# Patient Record
Sex: Male | Born: 1950 | Race: White | Hispanic: No | Marital: Married | Smoking: Never smoker
Health system: Southern US, Community
[De-identification: ages and names within clinical notes are randomized; demographics above are authoritative.]

## PROBLEM LIST (undated history)

## (undated) DIAGNOSIS — M1990 Unspecified osteoarthritis, unspecified site: Secondary | ICD-10-CM

## (undated) DIAGNOSIS — G4730 Sleep apnea, unspecified: Secondary | ICD-10-CM

## (undated) DIAGNOSIS — I10 Essential (primary) hypertension: Secondary | ICD-10-CM

## (undated) DIAGNOSIS — D751 Secondary polycythemia: Secondary | ICD-10-CM

## (undated) DIAGNOSIS — F419 Anxiety disorder, unspecified: Secondary | ICD-10-CM

## (undated) DIAGNOSIS — C801 Malignant (primary) neoplasm, unspecified: Secondary | ICD-10-CM

## (undated) HISTORY — PX: ORCHIECTOMY: SHX2116

## (undated) HISTORY — PX: COLONOSCOPY: SHX174

## (undated) HISTORY — PX: HERNIA REPAIR: SHX51

## (undated) HISTORY — PX: ANTERIOR CRUCIATE LIGAMENT REPAIR: SHX115

---

## 2011-08-01 ENCOUNTER — Encounter (HOSPITAL_BASED_OUTPATIENT_CLINIC_OR_DEPARTMENT_OTHER): Payer: Self-pay | Admitting: *Deleted

## 2011-08-01 ENCOUNTER — Other Ambulatory Visit: Payer: Self-pay | Admitting: Orthopedic Surgery

## 2011-08-01 NOTE — Progress Notes (Signed)
Pt added on surgery tomorrow.-called 4x-finally got pt Affect very flat-monotone voice-simple answers-very hard to get any information-poor historian Called pcp for notes and labs-has polycythemia-not had a phlebotomy in yrs. Denies any cardiac issues Had a recent sleep study-moderate-has not gone for cpap titration To come in 6am-bring all meds-pk an overnight bag

## 2011-08-01 NOTE — Progress Notes (Signed)
Reviewed assessment with dr frederick-waiting on PCP notes-did tell pt he may need to stay overnight due to new dx sleep apnea without a cpap

## 2011-08-02 ENCOUNTER — Encounter (HOSPITAL_BASED_OUTPATIENT_CLINIC_OR_DEPARTMENT_OTHER): Payer: Self-pay | Admitting: *Deleted

## 2011-08-02 ENCOUNTER — Ambulatory Visit (HOSPITAL_BASED_OUTPATIENT_CLINIC_OR_DEPARTMENT_OTHER)
Admission: RE | Admit: 2011-08-02 | Discharge: 2011-08-02 | Disposition: A | Discharge status: Home / Self Care | Payer: BC Managed Care – PPO | Source: Ambulatory Visit | Attending: Orthopedic Surgery | Admitting: Orthopedic Surgery

## 2011-08-02 ENCOUNTER — Ambulatory Visit (HOSPITAL_BASED_OUTPATIENT_CLINIC_OR_DEPARTMENT_OTHER): Payer: BC Managed Care – PPO | Admitting: Anesthesiology

## 2011-08-02 ENCOUNTER — Encounter (HOSPITAL_BASED_OUTPATIENT_CLINIC_OR_DEPARTMENT_OTHER): Payer: Self-pay | Admitting: Anesthesiology

## 2011-08-02 ENCOUNTER — Other Ambulatory Visit: Payer: Self-pay

## 2011-08-02 ENCOUNTER — Encounter (HOSPITAL_BASED_OUTPATIENT_CLINIC_OR_DEPARTMENT_OTHER)
Admission: RE | Disposition: A | Discharge status: Home / Self Care | Payer: Self-pay | Source: Ambulatory Visit | Attending: Orthopedic Surgery

## 2011-08-02 DIAGNOSIS — G4730 Sleep apnea, unspecified: Secondary | ICD-10-CM | POA: Insufficient documentation

## 2011-08-02 DIAGNOSIS — M2350 Chronic instability of knee, unspecified knee: Secondary | ICD-10-CM | POA: Insufficient documentation

## 2011-08-02 DIAGNOSIS — M2240 Chondromalacia patellae, unspecified knee: Secondary | ICD-10-CM | POA: Insufficient documentation

## 2011-08-02 DIAGNOSIS — Z8547 Personal history of malignant neoplasm of testis: Secondary | ICD-10-CM | POA: Insufficient documentation

## 2011-08-02 DIAGNOSIS — M23329 Other meniscus derangements, posterior horn of medial meniscus, unspecified knee: Secondary | ICD-10-CM | POA: Insufficient documentation

## 2011-08-02 DIAGNOSIS — I10 Essential (primary) hypertension: Secondary | ICD-10-CM | POA: Insufficient documentation

## 2011-08-02 HISTORY — DX: Essential (primary) hypertension: I10

## 2011-08-02 HISTORY — DX: Malignant (primary) neoplasm, unspecified: C80.1

## 2011-08-02 HISTORY — DX: Sleep apnea, unspecified: G47.30

## 2011-08-02 HISTORY — DX: Secondary polycythemia: D75.1

## 2011-08-02 LAB — POCT I-STAT, CHEM 8
BUN: 23 mg/dL (ref 6–23)
Calcium, Ion: 1.12 mmol/L (ref 1.12–1.23)
Chloride: 105 mEq/L (ref 96–112)
Creatinine, Ser: 1.3 mg/dL (ref 0.50–1.35)
Glucose, Bld: 102 mg/dL — ABNORMAL HIGH (ref 70–99)
HCT: 56 % — ABNORMAL HIGH (ref 39.0–52.0)
Hemoglobin: 19 g/dL — ABNORMAL HIGH (ref 13.0–17.0)
Potassium: 4.7 mEq/L (ref 3.5–5.1)
Sodium: 139 mEq/L (ref 135–145)
TCO2: 27 mmol/L (ref 0–100)

## 2011-08-02 SURGERY — KNEE ARTHROSCOPY WITH ANTERIOR CRUCIATE LIGAMENT (ACL) REPAIR
Anesthesia: General | Site: Knee | Laterality: Left | Wound class: Clean

## 2011-08-02 MED ORDER — OXYCODONE-ACETAMINOPHEN 5-325 MG PO TABS: 1.0000 | ORAL_TABLET | Freq: Four times a day (QID) | ORAL | Status: AC | PRN

## 2011-08-02 MED ORDER — LACTATED RINGERS IV SOLN
INTRAVENOUS | Status: DC
  Administered 2011-08-02: 07:00:00 via INTRAVENOUS

## 2011-08-02 MED ORDER — HYDROMORPHONE HCL PF 1 MG/ML IJ SOLN
0.2500 mg | INTRAMUSCULAR | Status: DC | PRN
  Administered 2011-08-02 (×2): 0.5 mg via INTRAVENOUS

## 2011-08-02 MED ORDER — FENTANYL CITRATE 0.05 MG/ML IJ SOLN
INTRAMUSCULAR | Status: DC | PRN
  Administered 2011-08-02: 50 ug via INTRAVENOUS

## 2011-08-02 MED ORDER — KETOROLAC TROMETHAMINE 30 MG/ML IJ SOLN
INTRAMUSCULAR | Status: DC | PRN
  Administered 2011-08-02: 30 mg via INTRAVENOUS

## 2011-08-02 MED ORDER — CEFAZOLIN SODIUM-DEXTROSE 2-3 GM-% IV SOLR
2.0000 g | INTRAVENOUS | Status: AC
  Administered 2011-08-02: 2 g via INTRAVENOUS

## 2011-08-02 MED ORDER — DEXAMETHASONE SODIUM PHOSPHATE 4 MG/ML IJ SOLN
INTRAMUSCULAR | Status: DC | PRN
  Administered 2011-08-02: 10 mg via INTRAVENOUS

## 2011-08-02 MED ORDER — MIDAZOLAM HCL 2 MG/2ML IJ SOLN
1.0000 mg | INTRAMUSCULAR | Status: DC | PRN
  Administered 2011-08-02: 2 mg via INTRAVENOUS

## 2011-08-02 MED ORDER — LIDOCAINE HCL (CARDIAC) 20 MG/ML IV SOLN
INTRAVENOUS | Status: DC | PRN
  Administered 2011-08-02: 50 mg via INTRAVENOUS

## 2011-08-02 MED ORDER — CEFAZOLIN SODIUM 1-5 GM-% IV SOLN
1.0000 g | Freq: Once | INTRAVENOUS | Status: AC
  Administered 2011-08-02: 1 g via INTRAVENOUS

## 2011-08-02 MED ORDER — ONDANSETRON HCL 4 MG/2ML IJ SOLN
INTRAMUSCULAR | Status: DC | PRN
  Administered 2011-08-02: 4 mg via INTRAVENOUS

## 2011-08-02 MED ORDER — ACETAMINOPHEN 10 MG/ML IV SOLN
1000.0000 mg | Freq: Once | INTRAVENOUS | Status: AC
  Administered 2011-08-02: 1000 mg via INTRAVENOUS

## 2011-08-02 MED ORDER — EPHEDRINE SULFATE 50 MG/ML IJ SOLN
INTRAMUSCULAR | Status: DC | PRN
  Administered 2011-08-02 (×2): 10 mg via INTRAVENOUS
  Administered 2011-08-02: 5 mg via INTRAVENOUS
  Administered 2011-08-02: 10 mg via INTRAVENOUS

## 2011-08-02 MED ORDER — PHENYLEPHRINE HCL 10 MG/ML IJ SOLN
INTRAMUSCULAR | Status: DC | PRN
  Administered 2011-08-02 (×2): 80 ug via INTRAVENOUS

## 2011-08-02 MED ORDER — PROPOFOL 10 MG/ML IV EMUL
INTRAVENOUS | Status: DC | PRN
  Administered 2011-08-02: 300 mg via INTRAVENOUS

## 2011-08-02 MED ORDER — BUPIVACAINE-EPINEPHRINE PF 0.5-1:200000 % IJ SOLN
INTRAMUSCULAR | Status: DC | PRN
  Administered 2011-08-02: 25 mL

## 2011-08-02 MED ORDER — FENTANYL CITRATE 0.05 MG/ML IJ SOLN
50.0000 ug | INTRAMUSCULAR | Status: DC | PRN
  Administered 2011-08-02: 100 ug via INTRAVENOUS

## 2011-08-02 MED ORDER — LACTATED RINGERS IV SOLN
INTRAVENOUS | Status: DC
  Administered 2011-08-02 (×3): via INTRAVENOUS

## 2011-08-02 MED ORDER — CHLORHEXIDINE GLUCONATE 4 % EX LIQD: 60.0000 mL | Freq: Once | CUTANEOUS | Status: DC

## 2011-08-02 MED ORDER — OXYCODONE HCL 5 MG PO TABS: 5.0000 mg | ORAL_TABLET | Freq: Once | ORAL | Status: DC | PRN

## 2011-08-02 MED ORDER — METOCLOPRAMIDE HCL 5 MG/ML IJ SOLN: 10.0000 mg | Freq: Once | INTRAMUSCULAR | Status: DC | PRN

## 2011-08-02 MED ORDER — OXYCODONE HCL 5 MG/5ML PO SOLN: 5.0000 mg | Freq: Once | ORAL | Status: DC | PRN

## 2011-08-02 SURGICAL SUPPLY — 71 items
BANDAGE ESMARK 6X9 LF (GAUZE/BANDAGES/DRESSINGS) IMPLANT
BENZOIN TINCTURE PRP APPL 2/3 (GAUZE/BANDAGES/DRESSINGS) ×2 IMPLANT
BLADE 4.2CUDA (BLADE) IMPLANT
BLADE AVERAGE 25X9 (BLADE) ×2 IMPLANT
BLADE CUDA 5.5 (BLADE) IMPLANT
BLADE CUTTER GATOR 3.5 (BLADE) IMPLANT
BLADE GREAT WHITE 4.2 (BLADE) ×2 IMPLANT
BLADE OSCIL/SAGITTAL W/10 ST (BLADE) IMPLANT
BLADE SURG 15 STRL LF DISP TIS (BLADE) ×1 IMPLANT
BLADE SURG 15 STRL SS (BLADE) ×1
BNDG ESMARK 6X9 LF (GAUZE/BANDAGES/DRESSINGS)
BUR OVAL 4.0 (BURR) ×2 IMPLANT
CANISTER OMNI JUG 16 LITER (MISCELLANEOUS) ×2 IMPLANT
CANISTER SUCTION 2500CC (MISCELLANEOUS) IMPLANT
CLOTH BEACON ORANGE TIMEOUT ST (SAFETY) ×2 IMPLANT
COVER TABLE BACK 60X90 (DRAPES) ×2 IMPLANT
DRAPE ARTHROSCOPY W/POUCH 114 (DRAPES) ×2 IMPLANT
DRSG EMULSION OIL 3X3 NADH (GAUZE/BANDAGES/DRESSINGS) ×2 IMPLANT
DURAPREP 26ML APPLICATOR (WOUND CARE) ×2 IMPLANT
ELECT MENISCUS 165MM 90D (ELECTRODE) IMPLANT
ELECT REM PT RETURN 9FT ADLT (ELECTROSURGICAL) ×2
ELECTRODE REM PT RTRN 9FT ADLT (ELECTROSURGICAL) ×1 IMPLANT
GLOVE BIOGEL PI IND STRL 8 (GLOVE) ×3 IMPLANT
GLOVE BIOGEL PI INDICATOR 8 (GLOVE) ×3
GLOVE ECLIPSE 7.5 STRL STRAW (GLOVE) ×4 IMPLANT
GOWN BRE IMP PREV XXLGXLNG (GOWN DISPOSABLE) ×2 IMPLANT
GOWN PREVENTION PLUS XLARGE (GOWN DISPOSABLE) ×2 IMPLANT
GOWN PREVENTION PLUS XXLARGE (GOWN DISPOSABLE) ×4 IMPLANT
HOLDER KNEE FOAM BLUE (MISCELLANEOUS) ×2 IMPLANT
IMMOBILIZER KNEE 22 UNIV (SOFTGOODS) IMPLANT
IMMOBILIZER KNEE 24 THIGH 36 (MISCELLANEOUS) ×1 IMPLANT
IMMOBILIZER KNEE 24 UNIV (MISCELLANEOUS) ×2
KIT TRANSTIBIAL (DISPOSABLE) ×2 IMPLANT
KNEE WRAP E Z 3 GEL PACK (MISCELLANEOUS) ×2 IMPLANT
KNIFE GRAFT ACL 10MM 5952 (MISCELLANEOUS) IMPLANT
KNIFE GRAFT ACL 11MM (MISCELLANEOUS) IMPLANT
NDL SAFETY ECLIPSE 18X1.5 (NEEDLE) IMPLANT
NEEDLE FILTER BLUNT 18X 1/2SAF (NEEDLE)
NEEDLE FILTER BLUNT 18X1 1/2 (NEEDLE) IMPLANT
NEEDLE HYPO 18GX1.5 SHARP (NEEDLE)
NEEDLE MENISCAL REPAIR DBL ARM (NEEDLE) IMPLANT
PACK ARTHROSCOPY DSU (CUSTOM PROCEDURE TRAY) ×2 IMPLANT
PACK BASIN DAY SURGERY FS (CUSTOM PROCEDURE TRAY) ×2 IMPLANT
PAD CAST 4YDX4 CTTN HI CHSV (CAST SUPPLIES) ×2 IMPLANT
PADDING CAST COTTON 4X4 STRL (CAST SUPPLIES) ×2
PASSER SUT SWANSON 36MM LOOP (INSTRUMENTS) IMPLANT
PATELLA LIGAMENT BISECTED FR (Tissue) ×2 IMPLANT
PENCIL BUTTON HOLSTER BLD 10FT (ELECTRODE) IMPLANT
SCREW INTERFERENCE 8X25MM (Screw) ×2 IMPLANT
SCREW SHEATHED INTERF 7X20 (Screw) ×2 IMPLANT
SET ARTHROSCOPY TUBING (MISCELLANEOUS) ×1
SET ARTHROSCOPY TUBING LN (MISCELLANEOUS) ×1 IMPLANT
SHEET MEDIUM DRAPE 40X70 STRL (DRAPES) ×2 IMPLANT
SPONGE GAUZE 4X4 12PLY (GAUZE/BANDAGES/DRESSINGS) ×2 IMPLANT
SPONGE LAP 4X18 X RAY DECT (DISPOSABLE) ×2 IMPLANT
STRIP CLOSURE SKIN 1/2X4 (GAUZE/BANDAGES/DRESSINGS) ×2 IMPLANT
SUCTION FRAZIER TIP 10 FR DISP (SUCTIONS) ×2 IMPLANT
SUT ETHILON 4 0 PS 2 18 (SUTURE) IMPLANT
SUT MNCRL AB 3-0 PS2 18 (SUTURE) ×2 IMPLANT
SUT PDS AB 1 CT  36 (SUTURE) ×2
SUT PDS AB 1 CT 36 (SUTURE) ×2 IMPLANT
SUT STEEL 5 (SUTURE) ×2 IMPLANT
SUT TICRON 1 T 12 (SUTURE) IMPLANT
SUT VIC AB 0 CT1 27 (SUTURE)
SUT VIC AB 0 CT1 27XBRD ANBCTR (SUTURE) IMPLANT
SUT VIC AB 2-0 SH 27 (SUTURE)
SUT VIC AB 2-0 SH 27XBRD (SUTURE) IMPLANT
SYR 5ML LL (SYRINGE) ×2 IMPLANT
TOWEL OR 17X24 6PK STRL BLUE (TOWEL DISPOSABLE) ×6 IMPLANT
TOWEL OR NON WOVEN STRL DISP B (DISPOSABLE) ×2 IMPLANT
WATER STERILE IRR 1000ML POUR (IV SOLUTION) ×2 IMPLANT

## 2011-08-02 NOTE — Transfer of Care (Signed)
Immediate Anesthesia Transfer of Care Note  Patient: Christopher Ramsey  Procedure(s) Performed: Procedure(s) (LRB): KNEE ARTHROSCOPY WITH ANTERIOR CRUCIATE LIGAMENT (ACL) REPAIR (Left)  Patient Location: PACU  Anesthesia Type: GA combined with regional for post-op pain  Level of Consciousness: sedated  Airway & Oxygen Therapy: Patient Spontanous Breathing and Patient connected to face mask oxygen  Post-op Assessment: Report given to PACU RN and Post -op Vital signs reviewed and stable  Post vital signs: Reviewed and stable  Complications: No apparent anesthesia complications

## 2011-08-02 NOTE — Anesthesia Preprocedure Evaluation (Signed)
Anesthesia Evaluation  Patient identified by MRN, date of birth, ID band Patient awake    Reviewed: Allergy & Precautions, H&P , NPO status , Patient's Chart, lab work & pertinent test results, reviewed documented beta blocker date and time   Airway Mallampati: II TM Distance: >3 FB Neck ROM: full    Dental   Pulmonary sleep apnea ,  breath sounds clear to auscultation        Cardiovascular hypertension, On Medications Rhythm:regular     Neuro/Psych negative neurological ROS  negative psych ROS   GI/Hepatic negative GI ROS, Neg liver ROS,   Endo/Other  negative endocrine ROS  Renal/GU negative Renal ROS  negative genitourinary   Musculoskeletal   Abdominal   Peds  Hematology negative hematology ROS (+)   Anesthesia Other Findings See surgeon's H&P   Reproductive/Obstetrics negative OB ROS                           Anesthesia Physical Anesthesia Plan  ASA: III  Anesthesia Plan: General   Post-op Pain Management:    Induction: Intravenous  Airway Management Planned: LMA  Additional Equipment:   Intra-op Plan:   Post-operative Plan: Extubation in OR  Informed Consent: I have reviewed the patients History and Physical, chart, labs and discussed the procedure including the risks, benefits and alternatives for the proposed anesthesia with the patient or authorized representative who has indicated his/her understanding and acceptance.   Dental Advisory Given  Plan Discussed with: CRNA and Surgeon  Anesthesia Plan Comments:         Anesthesia Quick Evaluation

## 2011-08-02 NOTE — Brief Op Note (Signed)
08/02/2011  9:43 AM  PATIENT:  Tor Netters  61 y.o. male  PRE-OPERATIVE DIAGNOSIS:  Anterior cruciate ligament TEAR AND MEDIAL MENISCUS TEAR  POST-OPERATIVE DIAGNOSIS:  same as preop plus chondromalacia  PROCEDURE:  Procedure(s) (LRB): KNEE ARTHROSCOPY WITH ANTERIOR CRUCIATE LIGAMENT (ACL) REPAIR (Left)  SURGEON:  Surgeon(s) and Role:    * Harvie Junior, MD - Primary  PHYSICIAN ASSISTANT:   ASSISTANTS: bethune   ANESTHESIA:   general  EBL:  Total I/O In: 2000 [I.V.:2000] Out: -   BLOOD ADMINISTERED:none  DRAINS: none   LOCAL MEDICATIONS USED:  NONE  SPECIMEN:  No Specimen  DISPOSITION OF SPECIMEN:  N/A  COUNTS:  YES  TOURNIQUET:   Total Tourniquet Time Documented: Thigh (Left) - 24 minutes  DICTATION: .Other Dictation: Dictation Number [id number]  PLAN OF CARE: Discharge to home after PACU  PATIENT DISPOSITION:  PACU - hemodynamically stable.   Delay start of Pharmacological VTE agent (>24hrs) due to surgical blood loss or risk of bleeding: not applicable

## 2011-08-02 NOTE — Anesthesia Procedure Notes (Addendum)
Anesthesia Regional Block:  Femoral nerve block  Pre-Anesthetic Checklist: ,, timeout performed, Correct Patient, Correct Site, Correct Laterality, Correct Procedure, Correct Position, site marked, Risks and benefits discussed,  Surgical consent,  Pre-op evaluation,  At surgeon's request and post-op pain management  Laterality: Left  Prep: chloraprep       Needles:   Needle Type: Other   (Arrow Echogenic)   Needle Length: 9cm  Needle Gauge: 21    Additional Needles:  Procedures: ultrasound guided Femoral nerve block Narrative:  Start time: 08/02/2011 7:18 AM End time: 08/02/2011 7:25 AM Injection made incrementally with aspirations every 5 mL.  Performed by: Personally  Anesthesiologist: C. Frederick MD  Additional Notes: Ultrasound guidance used to: id relevant anatomy, confirm needle position, local anesthetic spread, avoidance of vascular puncture. Picture saved. No complications. Block performed personally by Janetta Hora. Gelene Mink, MD    Femoral nerve block Procedure Name: LMA Insertion Date/Time: 08/02/2011 7:52 AM Performed by: Burna Cash Pre-anesthesia Checklist: Patient identified, Emergency Drugs available, Suction available and Patient being monitored Patient Re-evaluated:Patient Re-evaluated prior to inductionOxygen Delivery Method: Circle System Utilized Preoxygenation: Pre-oxygenation with 100% oxygen Intubation Type: IV induction Ventilation: Mask ventilation without difficulty LMA: LMA inserted LMA Size: 5.0 Number of attempts: 1 Airway Equipment and Method: bite block Placement Confirmation: positive ETCO2 Tube secured with: Tape Dental Injury: Teeth and Oropharynx as per pre-operative assessment

## 2011-08-02 NOTE — Progress Notes (Signed)
Assisted Dr. Frederick with left, ultrasound guided, femoral block. Side rails up, monitors on throughout procedure. See vital signs in flow sheet. Tolerated Procedure well. 

## 2011-08-02 NOTE — Anesthesia Postprocedure Evaluation (Signed)
Anesthesia Post Note  Patient: Christopher Ramsey  Procedure(s) Performed: Procedure(s) (LRB): KNEE ARTHROSCOPY WITH ANTERIOR CRUCIATE LIGAMENT (ACL) REPAIR (Left)  Anesthesia type: General  Patient location: PACU  Post pain: Pain level controlled  Post assessment: Patient's Cardiovascular Status Stable  Last Vitals:  Filed Vitals:   08/02/11 1030  BP: 135/69  Pulse: 79  Temp:   Resp: 17    Post vital signs: Reviewed and stable  Level of consciousness: alert  Complications: No apparent anesthesia complications

## 2011-08-02 NOTE — H&P (Signed)
PREOPERATIVE H&P  Chief Complaint: l lrg instability and pain  HPI: Christopher Ramsey is a 61 y.o. male who presents for evaluation of l knee instability and pain. It has been present for greater than 1 year and has been worsening. He has failed conservative measures. Pain is rated as moderate.  Past Medical History  Diagnosis Date  . Hypertension   . Polycythemia   . Cancer     hx testicular cancer  . Sleep apnea     recent sleep study-moderate-has not done cpap trial   Past Surgical History  Procedure Date  . Hernia repair     rt ing age 35  . 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 Marital Status: Married    Spouse Name: N/A    Number of Children: N/A  . Years of Education: N/A   Social History Main Topics  . Smoking status: Never Smoker   . Smokeless tobacco: None  . Alcohol Use: Yes     daily wine  . Drug Use: No  . Sexually Active:    Other Topics Concern  . None   Social History Narrative  . None   History reviewed. No pertinent family history. No Known Allergies Prior to Admission medications   Medication Sig Start Date End Date Taking? Authorizing Provider  aspirin 325 MG tablet Take 325 mg by mouth daily.   Yes Historical Provider, MD  losartan (COZAAR) 100 MG tablet Take 100 mg by mouth daily.   Yes Historical Provider, MD  testosterone (ANDROGEL) 50 MG/5GM GEL Place 5 g onto the skin daily.   Yes Historical Provider, MD     Positive ROS: none  All other systems have been reviewed and were otherwise negative with the exception of those mentioned in the HPI and as above.  Physical Exam: Filed Vitals:   08/02/11 0736  BP:   Pulse: 80  Temp:   Resp: 13    General: Alert, no acute distress Cardiovascular: No pedal edema Respiratory: No cyanosis, no use of accessory musculature GI: No organomegaly, abdomen is soft and non-tender Skin: No lesions in the area of chief complaint Neurologic:  Sensation intact distally Psychiatric: Patient is competent for consent with normal mood and affect Lymphatic: No axillary or cervical lymphadenopathy  MUSCULOSKELETAL: l. Knee : + instability/ lochman and pivot shift.  + Mcmurray med side  Assessment/Plan: ACL TEAR AND MEDIAL MENISCUS TEAR  Pt has history of instability as his primary complaint  Plan for Procedure(s): KNEE ARTHROSCOPY WITH ANTERIOR CRUCIATE LIGAMENT (ACL) REPAIR  The risks benefits and alternatives were discussed with the patient including but not limited to the risks of nonoperative treatment, versus surgical intervention including infection, bleeding, nerve injury, malunion, nonunion, hardware prominence, hardware failure, need for hardware removal, blood clots, cardiopulmonary complications, morbidity, mortality, among others, and they were willing to proceed.  Predicted outcome is good, although there will be at least a six to nine month expected recovery.  Weltha Cathy L, MD 08/02/2011 7:38 AM

## 2011-08-05 NOTE — Op Note (Signed)
NAMEDEXTON, [name]                ACCOUNT NO.:  [id number]  MEDICAL RECORD NO.:  [id number]  LOCATION:                               FACILITY:  [facility]  PHYSICIAN:  Harvie Junior, M.D.        DATE OF BIRTH:  DATE OF PROCEDURE:  08/02/2011 DATE OF DISCHARGE:  08/02/2011                              OPERATIVE REPORT   PREOPERATIVE DIAGNOSES: 1. Left knee instability. 2. Medial meniscal tear.  POSTOPERATIVE DIAGNOSES: 1. Left knee instability. 2. Medial meniscal tear. 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  PRINCIPAL PROCEDURE: 1. Anterior cruciate ligament reconstruction with central one-third     patellar tendon allograft. 2. Partial medial meniscectomy. 3. Chondroplasty of lateral tibial plateau.  SURGEON:  Harvie Junior, MD  ASSISTANT:  Marshia Ly, PA  ANESTHESIA:  General.  HISTORY:  Mr. Christopher Ramsey a 61 year old male history of having had significant complaints of instability in his left knee for many many years.  He unfortunately recently had a twisting injury and began having pain associated with it.  MRI showed that he had a medial meniscal tear. Plain x-ray showed no significant narrowing of the joint space.  His biggest complaint was instability that he had had over these many years and he wanted to have this corrected.  We talked at length about the issues related to ACL reconstruction in a 61 year old feeling that pain relief was really what we are after and that will be performed with a meniscectomy.  He continued to state that instability was his biggest issue and because of continued feeling that instability was the biggest issue here, we elected to do anterior cruciate ligament reconstruction with our arthroscopy with the provider that it there was significant arthritic change or degenerative change that we would not do ACL reconstruction.  I think he understood all the issues, all the risks, and he was taken to the operating room for this  procedure.  PROCEDURE:  The patient was taken to the operating room and after adequate anesthesia was obtained with general anesthetic, the patient was placed supine on the operating table.  The left leg was prepped and draped in usual sterile fashion.  Following this, the leg was examined arthroscopically and shown to have a posterior horn medial meniscal tear, which was debrided back to a smooth and stable rim.  In the lateral compartment, there were some grade 2 chondromalacia of the lateral tibial plateau, which was debrided.  Attention was turned to the notch where there was no fibers of the ACL on the prominent tibial spine.  There was significant stenosis of the notch.  Patellofemoral joint showed no degenerative changes.  At this point, given that there was no degenerative changes and instability was the biggest issue, we felt that ACL reconstruction was appropriate and so at this point, a notchplasty was performed with a motorized bur.  We had to take a tremendous amount of bone out to get to where we could see to put an ACL.  Once this was done, the ACL guide was used 7-mm anterior to the posterior cruciate and a guidewire was placed over this, it was overdrilled with an 11-mm tunnel  followed by over-the-top guide and a pilot hole was drilled to make sure that we had a tunnel on the femoral side and then this was drilled to a level of 25.  Once this was done, a notch was made and the graft was advanced into the knee.  Once this was done, the graft was locked in on the femoral side with a 7 x 20 screw with a sheath to protect the graft.  Once this was done, the knee was cycled 20 times.  No tendency towards an isometry, and at this point, the tibial side of graft was locked in place with 8 x 25 screw.  Once this was done, the leg was now examined, it was noted to be now Lachman's negative, pivot negative.  Final check was made medial and lateral from the loosen fragment, pieces  of bone and cartilage and none was seen.  At this time, the knee was copiously and thoroughly lavaged, suctioned dry, and the wound was now closed in layers.  Tourniquet was let up during the passage of the graft just could have some difficulty seeing with effort by 23 minutes.  Once this was done, the wounds were closed in layers.  Single stitch was placed in the medial wound, the lateral wound was left open.  Sterile compressive dressing was applied, and the patient was taken to the recovery room, was noted to be in satisfactory condition.  Estimated blood loss for the procedure was none.  Marshia Ly was assisted throughout the case, was critical in the performance with both preparation of the graft and retraction and allowing for passage of the graft.     Harvie Junior, M.D.     Christopher Ramsey  D:  08/02/2011  T:  08/03/2011  Job:  [id number]

## 2013-11-07 DEATH — deceased

## 2018-12-23 ENCOUNTER — Other Ambulatory Visit: Payer: Self-pay | Admitting: Neurosurgery

## 2018-12-23 DIAGNOSIS — M79604 Pain in right leg: Secondary | ICD-10-CM

## 2019-01-22 ENCOUNTER — Other Ambulatory Visit: Payer: Self-pay

## 2019-01-22 ENCOUNTER — Ambulatory Visit
Admission: RE | Admit: 2019-01-22 | Discharge: 2019-01-22 | Disposition: A | Discharge status: Home / Self Care | Payer: Medicare PPO | Source: Ambulatory Visit | Attending: Neurosurgery | Admitting: Neurosurgery

## 2019-01-22 DIAGNOSIS — M79604 Pain in right leg: Secondary | ICD-10-CM

## 2019-10-19 ENCOUNTER — Other Ambulatory Visit: Payer: Self-pay | Admitting: Neurosurgery

## 2019-10-21 ENCOUNTER — Other Ambulatory Visit: Payer: Self-pay | Admitting: Neurosurgery

## 2019-10-21 DIAGNOSIS — M8580 Other specified disorders of bone density and structure, unspecified site: Secondary | ICD-10-CM

## 2019-12-07 ENCOUNTER — Other Ambulatory Visit: Payer: Self-pay

## 2019-12-07 ENCOUNTER — Ambulatory Visit
Admission: RE | Admit: 2019-12-07 | Discharge: 2019-12-07 | Disposition: A | Discharge status: Home / Self Care | Payer: Medicare PPO | Source: Ambulatory Visit | Attending: Neurosurgery | Admitting: Neurosurgery

## 2019-12-07 DIAGNOSIS — M8580 Other specified disorders of bone density and structure, unspecified site: Secondary | ICD-10-CM

## 2020-01-12 NOTE — Progress Notes (Signed)
[id number] - [place], [place] - [address] [place] [address] Phone: [phone] Fax: [phone]      Your procedure is scheduled on January 10  Report to [facility] Main Entrance "A" at 0530 A.M., and check in at the Admitting office.  Call this number if you have problems the morning of surgery:  [phone]  Call [phone] if you have any questions prior to your surgery date Monday-Friday 8am-4pm    Remember:  Do not eat or drink after midnight the night before your surgery     Take these medicines the morning of surgery with A SIP OF WATER  allopurinol (ZYLOPRIM) if needed levothyroxine (SYNTHROID)  tamsulosin (FLOMAX)   As of today, STOP taking any Aspirin (unless otherwise instructed by your surgeon) Aleve, Naproxen, Ibuprofen, Motrin, Advil, Goody's, BC's, all herbal medications, fish oil, and all vitamins.                      Do not wear jewelry            Do not wear lotions, powders, colognes, or deodorant.            Men may shave face and neck.            Do not bring valuables to the hospital.            [facility] is not responsible for any belongings or valuables.  Do NOT Smoke (Tobacco/Vaping) or drink Alcohol 24 hours prior to your procedure If you use a CPAP at night, you may bring all equipment for your overnight stay.   Contacts, glasses, dentures or bridgework may not be worn into surgery.      For patients admitted to the hospital, discharge time will be determined by your treatment team.   Patients discharged the day of surgery will not be allowed to drive home, and someone needs to stay with them for 24 hours.    Special instructions:   [facility]- Preparing For Surgery  Before surgery, you can play an important role. Because skin is not sterile, your skin needs to be as free of germs as possible. You can reduce the number of germs on your skin by washing with CHG (chlorahexidine gluconate) Soap before  surgery.  CHG is an antiseptic cleaner which kills germs and bonds with the skin to continue killing germs even after washing.    Oral Hygiene is also important to reduce your risk of infection.  Remember - BRUSH YOUR TEETH THE MORNING OF SURGERY WITH YOUR REGULAR TOOTHPASTE  Please do not use if you have an allergy to CHG or antibacterial soaps. If your skin becomes reddened/irritated stop using the CHG.  Do not shave (including legs and underarms) for at least 48 hours prior to first CHG shower. It is OK to shave your face.  Please follow these instructions carefully.   1. Shower the NIGHT BEFORE SURGERY and the MORNING OF SURGERY with CHG Soap.   2. If you chose to wash your hair, wash your hair first as usual with your normal shampoo.  3. After you shampoo, rinse your hair and body thoroughly to remove the shampoo.  4. Use CHG as you would any other liquid soap. You can apply CHG directly to the skin and wash gently with a scrungie or a clean washcloth.   5. Apply the CHG Soap to your body ONLY FROM THE NECK DOWN.  Do not  use on open wounds or open sores. Avoid contact with your eyes, ears, mouth and genitals (private parts). Wash Face and genitals (private parts)  with your normal soap.   6. Wash thoroughly, paying special attention to the area where your surgery will be performed.  7. Thoroughly rinse your body with warm water from the neck down.  8. DO NOT shower/wash with your normal soap after using and rinsing off the CHG Soap.  9. Pat yourself dry with a CLEAN TOWEL.  10. Wear CLEAN PAJAMAS to bed the night before surgery  11. Place CLEAN SHEETS on your bed the night of your first shower and DO NOT SLEEP WITH PETS.   Day of Surgery: Wear Clean/Comfortable clothing the morning of surgery Do not apply any deodorants/lotions.   Remember to brush your teeth WITH YOUR REGULAR TOOTHPASTE.   Please read over the following fact sheets that you were given.

## 2020-01-13 ENCOUNTER — Other Ambulatory Visit (HOSPITAL_COMMUNITY)
Admission: RE | Admit: 2020-01-13 | Discharge: 2020-01-13 | Disposition: A | Discharge status: Home / Self Care | Payer: Medicare PPO | Source: Ambulatory Visit | Attending: Neurosurgery | Admitting: Neurosurgery

## 2020-01-13 ENCOUNTER — Other Ambulatory Visit: Payer: Self-pay

## 2020-01-13 ENCOUNTER — Encounter (HOSPITAL_COMMUNITY): Payer: Self-pay

## 2020-01-13 ENCOUNTER — Encounter (HOSPITAL_COMMUNITY)
Admission: RE | Admit: 2020-01-13 | Discharge: 2020-01-13 | Disposition: A | Discharge status: Home / Self Care | Payer: Medicare PPO | Source: Ambulatory Visit | Attending: Neurosurgery | Admitting: Neurosurgery

## 2020-01-13 DIAGNOSIS — I10 Essential (primary) hypertension: Secondary | ICD-10-CM | POA: Diagnosis not present

## 2020-01-13 DIAGNOSIS — Z01818 Encounter for other preprocedural examination: Secondary | ICD-10-CM | POA: Diagnosis not present

## 2020-01-13 DIAGNOSIS — M4326 Fusion of spine, lumbar region: Secondary | ICD-10-CM | POA: Diagnosis not present

## 2020-01-13 DIAGNOSIS — Z20822 Contact with and (suspected) exposure to covid-19: Secondary | ICD-10-CM | POA: Insufficient documentation

## 2020-01-13 DIAGNOSIS — Z01812 Encounter for preprocedural laboratory examination: Secondary | ICD-10-CM | POA: Insufficient documentation

## 2020-01-13 HISTORY — DX: Anxiety disorder, unspecified: F41.9

## 2020-01-13 HISTORY — DX: Unspecified osteoarthritis, unspecified site: M19.90

## 2020-01-13 LAB — CBC
HCT: 46.1 % (ref 39.0–52.0)
Hemoglobin: 16 g/dL (ref 13.0–17.0)
MCH: 32.9 pg (ref 26.0–34.0)
MCHC: 34.7 g/dL (ref 30.0–36.0)
MCV: 94.9 fL (ref 80.0–100.0)
Platelets: 212 10*3/uL (ref 150–400)
RBC: 4.86 MIL/uL (ref 4.22–5.81)
RDW: 13.4 % (ref 11.5–15.5)
WBC: 7.7 10*3/uL (ref 4.0–10.5)
nRBC: 0 % (ref 0.0–0.2)

## 2020-01-13 LAB — SURGICAL PCR SCREEN
MRSA, PCR: NEGATIVE
Staphylococcus aureus: NEGATIVE

## 2020-01-13 LAB — BASIC METABOLIC PANEL
Anion gap: 9 (ref 5–15)
BUN: 21 mg/dL (ref 8–23)
CO2: 28 mmol/L (ref 22–32)
Calcium: 9.6 mg/dL (ref 8.9–10.3)
Chloride: 104 mmol/L (ref 98–111)
Creatinine, Ser: 1.21 mg/dL (ref 0.61–1.24)
GFR, Estimated: >60 mL/min (ref >60)
Glucose, Bld: 96 mg/dL (ref 70–99)
Potassium: 3.4 mmol/L — ABNORMAL LOW (ref 3.5–5.1)
Sodium: 141 mmol/L (ref 135–145)

## 2020-01-13 LAB — TYPE AND SCREEN
ABO/RH(D): O POS
Antibody Screen: NEGATIVE

## 2020-01-13 NOTE — Progress Notes (Signed)
Your procedure is scheduled on January 10  Report to [facility] Main Entrance "A" at 0530 A.M., and check in at the Admitting office.              Your surgery or procedure is scheduled to begin at 7:30 AM  Call this number if you have problems the morning of surgery: [phone]  Call [phone] if you have any questions prior to your surgery date Monday-Friday 8am-4pm  Remember:  Do not eat or drink after midnight the night before your surgery     Take these medicines the morning of surgery with A SIP OF WATER  allopurinol (ZYLOPRIM) if needed levothyroxine (SYNTHROID)  tamsulosin (FLOMAX)   As of today, STOP taking any Aspirin (unless otherwise instructed by your surgeon) Aleve, Naproxen, Ibuprofen, Motrin, Advil, Goody's, BC's, all herbal medications, fish oil, and all vitamins.                  Special instructions:   [facility]- Preparing For Surgery  Before surgery, you can play an important role. Because skin is not sterile, your skin needs to be as free of germs as possible. You can reduce the number of germs on your skin by washing with CHG (chlorahexidine gluconate) Soap before surgery.  CHG is an antiseptic cleaner which kills germs and bonds with the skin to continue killing germs even after washing.    Oral Hygiene is also important to reduce your risk of infection.  Remember - BRUSH YOUR TEETH THE MORNING OF SURGERY WITH YOUR REGULAR TOOTHPASTE  Please do not use if you have an allergy to CHG or antibacterial soaps. If your skin becomes reddened/irritated stop using the CHG.  Do not shave (including legs and underarms) for at least 48 hours prior to first CHG shower. It is OK to shave your face.  Please follow these instructions carefully.   1. Shower the NIGHT BEFORE SURGERY and the MORNING OF SURGERY with CHG Soap.   2. If you chose to wash your hair, wash your hair first as usual with your normal shampoo.  3. After you shampoo, rinse your hair and body  thoroughly to remove the shampoo.  4. Use CHG as you would any other liquid soap. You can apply CHG directly to the skin and wash gently with a scrungie or a clean washcloth.   5. Apply the CHG Soap to your body ONLY FROM THE NECK DOWN.  Do not use on open wounds or open sores. Avoid contact with your eyes, ears, mouth and genitals (private parts). Wash Face and genitals (private parts)  with your normal soap.   6. Wash thoroughly, paying special attention to the area where your surgery will be performed.  7. Thoroughly rinse your body with warm water from the neck down.  8. DO NOT shower/wash with your normal soap after using and rinsing off the CHG Soap.  9. Pat yourself dry with a CLEAN TOWEL.  10. Wear CLEAN PAJAMAS to bed the night before surgery  11. Place CLEAN SHEETS on your bed the night of your first shower and DO NOT SLEEP WITH PETS.  Day of Surgery: Shower as instructed above.  Do not wear lotions, powders, colognes, or deodorant.              Wear clean, Comfprtable  Remember to brush your teeth WITH YOUR REGULAR   TOOTHPASTE.              Do not  wear jewelry              Do not wear lotions, powders, colognes, or deodorant.              Men may shave face and neck.              Do not bring valuables to the hospital.             [facility] is not responsible for any belongings or valuables.  Do NOT Smoke (Tobacco/Vaping) or drink Alcohol 24 hours prior to your procedure If you use a CPAP at night, you may bring all equipment for your overnight stay.   Contacts, glasses, dentures or bridgework may not be worn into surgery.      For patients admitted to the hospital, discharge time will be determined by your treatment team.   Patients discharged the day of surgery will not be allowed to drive home, and someone needs to stay with them for 24 hours.  Please read over the fact sheets that you were given.

## 2020-01-13 NOTE — Progress Notes (Signed)
PCP - Dr. Tressie Ellis  Cardiologist - none  Chest x-ray - na  EKG - 01/13/20  Stress Test - no  ECHO - no  Cardiac Cath - no  Sleep Study - yes "Mild case"per patient CPAP - no  LABS-CBC, BMP, T/S, PCR  ASA-no  ERAS-no  HA1C-na Fasting Blood Sugar - n a Checks Blood Sugar ____0_ times a day  Anesthesia-  Pt denies having chest pain, sob, or fever at this time. All instructions explained to the pt, with a verbal understanding of the material. Pt agrees to go over the instructions while at home for a better understanding. Pt also instructed to self quarantine after being tested for COVID-19. The opportunity to ask questions was provided.

## 2020-01-14 LAB — SARS CORONAVIRUS 2 (TAT 6-24 HRS): SARS Coronavirus 2: NEGATIVE

## 2020-01-16 NOTE — Anesthesia Preprocedure Evaluation (Addendum)
Anesthesia Evaluation  Patient identified by MRN, date of birth, ID band Patient awake    Reviewed: Allergy & Precautions, NPO status , Patient's Chart, lab work & pertinent test results  History of Anesthesia Complications Negative for: history of anesthetic complications  Airway Mallampati: III  TM Distance: >3 FB Neck ROM: Full    Dental no notable dental hx. (+) Dental Advisory Given   Pulmonary sleep apnea ,    Pulmonary exam normal        Cardiovascular hypertension, Pt. on medications Normal cardiovascular exam     Neuro/Psych    GI/Hepatic negative GI ROS, Neg liver ROS,   Endo/Other  negative endocrine ROS  Renal/GU negative Renal ROS     Musculoskeletal negative musculoskeletal ROS (+)   Abdominal   Peds  Hematology negative hematology ROS (+)   Anesthesia Other Findings   Reproductive/Obstetrics                            Anesthesia Physical Anesthesia Plan  ASA: II  Anesthesia Plan: General   Post-op Pain Management:    Induction: Intravenous  PONV Risk Score and Plan: 3 and Ondansetron, Dexamethasone and Midazolam  Airway Management Planned: Oral ETT  Additional Equipment:   Intra-op Plan:   Post-operative Plan: Extubation in OR  Informed Consent: I have reviewed the patients History and Physical, chart, labs and discussed the procedure including the risks, benefits and alternatives for the proposed anesthesia with the patient or authorized representative who has indicated his/her understanding and acceptance.     Dental advisory given  Plan Discussed with: Anesthesiologist and CRNA  Anesthesia Plan Comments:        Anesthesia Quick Evaluation

## 2020-01-17 ENCOUNTER — Inpatient Hospital Stay (HOSPITAL_COMMUNITY)
Admission: RE | Admit: 2020-01-17 | Discharge: 2020-01-18 | DRG: 455 | Disposition: A | Discharge status: Home / Self Care | Payer: Medicare PPO | Attending: Neurosurgery | Admitting: Neurosurgery

## 2020-01-17 ENCOUNTER — Ambulatory Visit (HOSPITAL_COMMUNITY): Payer: Medicare PPO | Admitting: Anesthesiology

## 2020-01-17 ENCOUNTER — Inpatient Hospital Stay (HOSPITAL_COMMUNITY)
Admission: RE | Disposition: A | Discharge status: Home / Self Care | Payer: Self-pay | Source: Home / Self Care | Attending: Neurosurgery

## 2020-01-17 ENCOUNTER — Ambulatory Visit (HOSPITAL_COMMUNITY): Payer: Medicare PPO

## 2020-01-17 ENCOUNTER — Ambulatory Visit (HOSPITAL_COMMUNITY): Payer: Medicare PPO | Admitting: Physician Assistant

## 2020-01-17 ENCOUNTER — Encounter (HOSPITAL_COMMUNITY): Payer: Self-pay | Admitting: Neurosurgery

## 2020-01-17 ENCOUNTER — Other Ambulatory Visit: Payer: Self-pay

## 2020-01-17 DIAGNOSIS — M5136 Other intervertebral disc degeneration, lumbar region: Secondary | ICD-10-CM | POA: Diagnosis present

## 2020-01-17 DIAGNOSIS — Z79890 Hormone replacement therapy (postmenopausal): Secondary | ICD-10-CM

## 2020-01-17 DIAGNOSIS — I10 Essential (primary) hypertension: Secondary | ICD-10-CM | POA: Diagnosis present

## 2020-01-17 DIAGNOSIS — M4316 Spondylolisthesis, lumbar region: Secondary | ICD-10-CM | POA: Diagnosis present

## 2020-01-17 DIAGNOSIS — M5126 Other intervertebral disc displacement, lumbar region: Secondary | ICD-10-CM | POA: Diagnosis present

## 2020-01-17 DIAGNOSIS — M48062 Spinal stenosis, lumbar region with neurogenic claudication: Secondary | ICD-10-CM | POA: Diagnosis present

## 2020-01-17 DIAGNOSIS — M48061 Spinal stenosis, lumbar region without neurogenic claudication: Secondary | ICD-10-CM | POA: Diagnosis present

## 2020-01-17 DIAGNOSIS — Z8547 Personal history of malignant neoplasm of testis: Secondary | ICD-10-CM

## 2020-01-17 DIAGNOSIS — Z419 Encounter for procedure for purposes other than remedying health state, unspecified: Secondary | ICD-10-CM

## 2020-01-17 DIAGNOSIS — G4730 Sleep apnea, unspecified: Secondary | ICD-10-CM | POA: Diagnosis present

## 2020-01-17 DIAGNOSIS — F418 Other specified anxiety disorders: Secondary | ICD-10-CM | POA: Diagnosis present

## 2020-01-17 DIAGNOSIS — Z79899 Other long term (current) drug therapy: Secondary | ICD-10-CM | POA: Diagnosis not present

## 2020-01-17 LAB — ABO/RH: ABO/RH(D): O POS

## 2020-01-17 SURGERY — POSTERIOR LUMBAR FUSION 3 LEVEL
Anesthesia: General | Site: Spine Lumbar

## 2020-01-17 MED ORDER — LIDOCAINE-EPINEPHRINE 1 %-1:100000 IJ SOLN
INTRAMUSCULAR | Status: DC | PRN
  Administered 2020-01-17: 10 mL

## 2020-01-17 MED ORDER — SUGAMMADEX SODIUM 200 MG/2ML IV SOLN
INTRAVENOUS | Status: DC | PRN
  Administered 2020-01-17: 200 mg via INTRAVENOUS

## 2020-01-17 MED ORDER — 0.9 % SODIUM CHLORIDE (POUR BTL) OPTIME
TOPICAL | Status: DC | PRN
  Administered 2020-01-17: 1000 mL

## 2020-01-17 MED ORDER — PHENOL 1.4 % MT LIQD: 1.0000 | OROMUCOSAL | Status: DC | PRN

## 2020-01-17 MED ORDER — ORAL CARE MOUTH RINSE: 15.0000 mL | Freq: Once | OROMUCOSAL | Status: AC

## 2020-01-17 MED ORDER — FENTANYL CITRATE (PF) 100 MCG/2ML IJ SOLN
25.0000 ug | INTRAMUSCULAR | Status: DC | PRN
  Administered 2020-01-17: 50 ug via INTRAVENOUS

## 2020-01-17 MED ORDER — PANTOPRAZOLE SODIUM 40 MG PO TBEC
40.0000 mg | DELAYED_RELEASE_TABLET | Freq: Every day | ORAL | Status: DC
  Administered 2020-01-17: 40 mg via ORAL
  Filled 2020-01-17: qty 1

## 2020-01-17 MED ORDER — PROPOFOL 10 MG/ML IV BOLUS
INTRAVENOUS | Status: DC | PRN
  Administered 2020-01-17: 200 mg via INTRAVENOUS

## 2020-01-17 MED ORDER — PHENYLEPHRINE HCL (PRESSORS) 10 MG/ML IV SOLN
INTRAVENOUS | Status: DC | PRN
  Administered 2020-01-17 (×2): 80 ug via INTRAVENOUS
  Administered 2020-01-17 (×2): 120 ug via INTRAVENOUS

## 2020-01-17 MED ORDER — ONDANSETRON HCL 4 MG/2ML IJ SOLN: 4.0000 mg | Freq: Four times a day (QID) | INTRAMUSCULAR | Status: DC | PRN

## 2020-01-17 MED ORDER — SUFENTANIL CITRATE 50 MCG/ML IV SOLN
INTRAVENOUS | Status: AC
  Filled 2020-01-17: qty 1

## 2020-01-17 MED ORDER — ACETAMINOPHEN 500 MG PO TABS
1000.0000 mg | ORAL_TABLET | Freq: Once | ORAL | Status: AC
  Administered 2020-01-17: 1000 mg via ORAL
  Filled 2020-01-17: qty 2

## 2020-01-17 MED ORDER — CHLORHEXIDINE GLUCONATE CLOTH 2 % EX PADS: 6.0000 | MEDICATED_PAD | Freq: Once | CUTANEOUS | Status: DC

## 2020-01-17 MED ORDER — PHENYLEPHRINE 40 MCG/ML (10ML) SYRINGE FOR IV PUSH (FOR BLOOD PRESSURE SUPPORT)
PREFILLED_SYRINGE | INTRAVENOUS | Status: AC
  Filled 2020-01-17: qty 10

## 2020-01-17 MED ORDER — MIDAZOLAM HCL 2 MG/2ML IJ SOLN
INTRAMUSCULAR | Status: DC | PRN
  Administered 2020-01-17: 2 mg via INTRAVENOUS

## 2020-01-17 MED ORDER — CYCLOBENZAPRINE HCL 10 MG PO TABS
ORAL_TABLET | ORAL | Status: AC
  Administered 2020-01-17: 10 mg via ORAL
  Filled 2020-01-17: qty 1

## 2020-01-17 MED ORDER — SUFENTANIL CITRATE 50 MCG/ML IV SOLN
INTRAVENOUS | Status: DC | PRN
  Administered 2020-01-17 (×3): 10 ug via INTRAVENOUS

## 2020-01-17 MED ORDER — LIDOCAINE 2% (20 MG/ML) 5 ML SYRINGE
INTRAMUSCULAR | Status: AC
  Filled 2020-01-17: qty 5

## 2020-01-17 MED ORDER — THROMBIN 5000 UNITS EX SOLR
CUTANEOUS | Status: AC
  Filled 2020-01-17: qty 5000

## 2020-01-17 MED ORDER — ROCURONIUM 10MG/ML (10ML) SYRINGE FOR MEDFUSION PUMP - OPTIME
INTRAVENOUS | Status: DC | PRN
  Administered 2020-01-17: 100 mg via INTRAVENOUS

## 2020-01-17 MED ORDER — PROPOFOL 10 MG/ML IV BOLUS
INTRAVENOUS | Status: AC
  Filled 2020-01-17: qty 20

## 2020-01-17 MED ORDER — CEFAZOLIN SODIUM-DEXTROSE 2-4 GM/100ML-% IV SOLN
2.0000 g | INTRAVENOUS | Status: AC
  Administered 2020-01-17 (×2): 2 g via INTRAVENOUS
  Filled 2020-01-17: qty 100

## 2020-01-17 MED ORDER — CELECOXIB 200 MG PO CAPS
200.0000 mg | ORAL_CAPSULE | Freq: Once | ORAL | Status: AC
  Administered 2020-01-17: 200 mg via ORAL
  Filled 2020-01-17: qty 1

## 2020-01-17 MED ORDER — SODIUM CHLORIDE 0.9 % IV SOLN: 250.0000 mL | INTRAVENOUS | Status: DC

## 2020-01-17 MED ORDER — ACETAMINOPHEN 650 MG RE SUPP: 650.0000 mg | RECTAL | Status: DC | PRN

## 2020-01-17 MED ORDER — ONDANSETRON HCL 4 MG/2ML IJ SOLN
INTRAMUSCULAR | Status: DC | PRN
  Administered 2020-01-17: 4 mg via INTRAVENOUS

## 2020-01-17 MED ORDER — ONDANSETRON HCL 4 MG PO TABS: 4.0000 mg | ORAL_TABLET | Freq: Four times a day (QID) | ORAL | Status: DC | PRN

## 2020-01-17 MED ORDER — BUPIVACAINE LIPOSOME 1.3 % IJ SUSP
20.0000 mL | Freq: Once | INTRAMUSCULAR | Status: AC
  Administered 2020-01-17: 20 mL
  Filled 2020-01-17: qty 20

## 2020-01-17 MED ORDER — LACTATED RINGERS IV SOLN: INTRAVENOUS | Status: DC | PRN

## 2020-01-17 MED ORDER — PROMETHAZINE HCL 25 MG/ML IJ SOLN
INTRAMUSCULAR | Status: AC
  Filled 2020-01-17: qty 1

## 2020-01-17 MED ORDER — ALUM & MAG HYDROXIDE-SIMETH 200-200-20 MG/5ML PO SUSP
30.0000 mL | Freq: Four times a day (QID) | ORAL | Status: DC | PRN
Start: 2020-01-17 — End: 2020-01-18

## 2020-01-17 MED ORDER — VITAMIN D 25 MCG (1000 UNIT) PO TABS
1000.0000 [IU] | ORAL_TABLET | Freq: Every day | ORAL | Status: DC
  Administered 2020-01-18: 1000 [IU] via ORAL
  Filled 2020-01-17: qty 1

## 2020-01-17 MED ORDER — SODIUM CHLORIDE 0.9% FLUSH: 3.0000 mL | INTRAVENOUS | Status: DC | PRN

## 2020-01-17 MED ORDER — PHENYLEPHRINE HCL-NACL 10-0.9 MG/250ML-% IV SOLN
INTRAVENOUS | Status: DC | PRN
  Administered 2020-01-17: 50 ug/min via INTRAVENOUS

## 2020-01-17 MED ORDER — THROMBIN 20000 UNITS EX SOLR
CUTANEOUS | Status: AC
  Filled 2020-01-17: qty 20000

## 2020-01-17 MED ORDER — ALBUMIN HUMAN 5 % IV SOLN: INTRAVENOUS | Status: DC | PRN

## 2020-01-17 MED ORDER — ONDANSETRON HCL 4 MG/2ML IJ SOLN
INTRAMUSCULAR | Status: AC
  Filled 2020-01-17: qty 2

## 2020-01-17 MED ORDER — CEFAZOLIN SODIUM-DEXTROSE 2-4 GM/100ML-% IV SOLN
2.0000 g | Freq: Three times a day (TID) | INTRAVENOUS | Status: DC
  Administered 2020-01-17 – 2020-01-18 (×2): 2 g via INTRAVENOUS
  Filled 2020-01-17 (×2): qty 100

## 2020-01-17 MED ORDER — MIDAZOLAM HCL 2 MG/2ML IJ SOLN
INTRAMUSCULAR | Status: AC
  Filled 2020-01-17: qty 2

## 2020-01-17 MED ORDER — FENTANYL CITRATE (PF) 100 MCG/2ML IJ SOLN
INTRAMUSCULAR | Status: AC
  Filled 2020-01-17: qty 2

## 2020-01-17 MED ORDER — SODIUM CHLORIDE (PF) 0.9 % IJ SOLN
INTRAMUSCULAR | Status: AC
  Filled 2020-01-17: qty 10

## 2020-01-17 MED ORDER — HYDROCHLOROTHIAZIDE 25 MG PO TABS
25.0000 mg | ORAL_TABLET | Freq: Every day | ORAL | Status: DC
  Administered 2020-01-18: 25 mg via ORAL
  Filled 2020-01-17: qty 1

## 2020-01-17 MED ORDER — MENTHOL 3 MG MT LOZG: 1.0000 | LOZENGE | OROMUCOSAL | Status: DC | PRN

## 2020-01-17 MED ORDER — PROMETHAZINE HCL 25 MG/ML IJ SOLN
6.2500 mg | INTRAMUSCULAR | Status: DC | PRN
  Administered 2020-01-17: 6.25 mg via INTRAVENOUS

## 2020-01-17 MED ORDER — ACETAMINOPHEN 325 MG PO TABS: 650.0000 mg | ORAL_TABLET | ORAL | Status: DC | PRN

## 2020-01-17 MED ORDER — CYCLOBENZAPRINE HCL 10 MG PO TABS
10.0000 mg | ORAL_TABLET | Freq: Three times a day (TID) | ORAL | Status: DC | PRN
  Administered 2020-01-18: 10 mg via ORAL
  Filled 2020-01-17 (×2): qty 1

## 2020-01-17 MED ORDER — CHLORHEXIDINE GLUCONATE 0.12 % MT SOLN
15.0000 mL | Freq: Once | OROMUCOSAL | Status: AC
  Administered 2020-01-17: 15 mL via OROMUCOSAL
  Filled 2020-01-17: qty 15

## 2020-01-17 MED ORDER — ALLOPURINOL 300 MG PO TABS
300.0000 mg | ORAL_TABLET | Freq: Every day | ORAL | Status: DC
  Administered 2020-01-18: 300 mg via ORAL
  Filled 2020-01-17: qty 1

## 2020-01-17 MED ORDER — DEXAMETHASONE SODIUM PHOSPHATE 10 MG/ML IJ SOLN
INTRAMUSCULAR | Status: AC
  Filled 2020-01-17: qty 1

## 2020-01-17 MED ORDER — DEXAMETHASONE SODIUM PHOSPHATE 10 MG/ML IJ SOLN
10.0000 mg | Freq: Once | INTRAMUSCULAR | Status: AC
  Administered 2020-01-17: 10 mg via INTRAVENOUS
  Filled 2020-01-17: qty 1

## 2020-01-17 MED ORDER — THROMBIN 20000 UNITS EX SOLR
CUTANEOUS | Status: DC | PRN
  Administered 2020-01-17: 20 mL via TOPICAL

## 2020-01-17 MED ORDER — LOSARTAN POTASSIUM 50 MG PO TABS
100.0000 mg | ORAL_TABLET | Freq: Every day | ORAL | Status: DC
  Filled 2020-01-17: qty 2

## 2020-01-17 MED ORDER — LIDOCAINE-EPINEPHRINE 1 %-1:100000 IJ SOLN
INTRAMUSCULAR | Status: AC
  Filled 2020-01-17: qty 1

## 2020-01-17 MED ORDER — SODIUM CHLORIDE 0.9% FLUSH
3.0000 mL | Freq: Two times a day (BID) | INTRAVENOUS | Status: DC
  Administered 2020-01-17: 3 mL via INTRAVENOUS

## 2020-01-17 MED ORDER — TAMSULOSIN HCL 0.4 MG PO CAPS
0.4000 mg | ORAL_CAPSULE | Freq: Every day | ORAL | Status: DC
  Administered 2020-01-18: 0.4 mg via ORAL
  Filled 2020-01-17: qty 1

## 2020-01-17 MED ORDER — LIDOCAINE HCL (CARDIAC) PF 100 MG/5ML IV SOSY
PREFILLED_SYRINGE | INTRAVENOUS | Status: DC | PRN
  Administered 2020-01-17: 100 mg via INTRAVENOUS

## 2020-01-17 MED ORDER — EPHEDRINE SULFATE 50 MG/ML IJ SOLN
INTRAMUSCULAR | Status: DC | PRN
  Administered 2020-01-17 (×4): 10 mg via INTRAVENOUS

## 2020-01-17 MED ORDER — PANTOPRAZOLE SODIUM 40 MG IV SOLR: 40.0000 mg | Freq: Every day | INTRAVENOUS | Status: DC

## 2020-01-17 MED ORDER — HYDROMORPHONE HCL 1 MG/ML IJ SOLN
0.5000 mg | INTRAMUSCULAR | Status: DC | PRN
  Administered 2020-01-17: 0.5 mg via INTRAVENOUS
  Filled 2020-01-17: qty 0.5

## 2020-01-17 MED ORDER — ROCURONIUM BROMIDE 10 MG/ML (PF) SYRINGE
PREFILLED_SYRINGE | INTRAVENOUS | Status: AC
  Filled 2020-01-17: qty 10

## 2020-01-17 MED ORDER — LEVOTHYROXINE SODIUM 25 MCG PO TABS
50.0000 ug | ORAL_TABLET | Freq: Every day | ORAL | Status: DC
  Administered 2020-01-18: 50 ug via ORAL
  Filled 2020-01-17 (×2): qty 2

## 2020-01-17 MED ORDER — LACTATED RINGERS IV SOLN: INTRAVENOUS | Status: DC

## 2020-01-17 MED ORDER — LOSARTAN POTASSIUM-HCTZ 100-25 MG PO TABS: 1.0000 | ORAL_TABLET | Freq: Every day | ORAL | Status: DC

## 2020-01-17 MED ORDER — OXYCODONE HCL 5 MG PO TABS
10.0000 mg | ORAL_TABLET | ORAL | Status: DC | PRN
  Administered 2020-01-18: 5 mg via ORAL
  Administered 2020-01-18: 10 mg via ORAL
  Filled 2020-01-17: qty 2

## 2020-01-17 MED ORDER — EPHEDRINE 5 MG/ML INJ
INTRAVENOUS | Status: AC
  Filled 2020-01-17: qty 10

## 2020-01-17 MED ORDER — HYDROXYZINE HCL 50 MG/ML IM SOLN: 50.0000 mg | Freq: Four times a day (QID) | INTRAMUSCULAR | Status: DC | PRN

## 2020-01-17 SURGICAL SUPPLY — 85 items
BASKET BONE COLLECTION (BASKET) ×2 IMPLANT
BENZOIN TINCTURE PRP APPL 2/3 (GAUZE/BANDAGES/DRESSINGS) ×2 IMPLANT
BLADE CLIPPER SURG (BLADE) IMPLANT
BLADE SURG 11 STRL SS (BLADE) ×2 IMPLANT
BONE VIVIGEN FORMABLE 5.4CC (Bone Implant) ×2 IMPLANT
BUR CUTTER 7.0 ROUND (BURR) ×4 IMPLANT
BUR MATCHSTICK NEURO 3.0 LAGG (BURR) ×2 IMPLANT
CANISTER SUCT 3000ML PPV (MISCELLANEOUS) ×2 IMPLANT
CAP LOCKING THREADED (Cap) ×16 IMPLANT
CARTRIDGE OIL MAESTRO DRILL (MISCELLANEOUS) ×1 IMPLANT
CNTNR URN SCR LID CUP LEK RST (MISCELLANEOUS) ×1 IMPLANT
CONT SPEC 4OZ STRL OR WHT (MISCELLANEOUS) ×1
COVER BACK TABLE 60X90IN (DRAPES) ×2 IMPLANT
COVER WAND RF STERILE (DRAPES) IMPLANT
DECANTER SPIKE VIAL GLASS SM (MISCELLANEOUS) ×2 IMPLANT
DERMABOND ADVANCED (GAUZE/BANDAGES/DRESSINGS) ×1
DERMABOND ADVANCED .7 DNX12 (GAUZE/BANDAGES/DRESSINGS) ×1 IMPLANT
DIFFUSER DRILL AIR PNEUMATIC (MISCELLANEOUS) ×2 IMPLANT
DRAPE C-ARM 42X72 X-RAY (DRAPES) ×2 IMPLANT
DRAPE C-ARMOR (DRAPES) ×2 IMPLANT
DRAPE HALF SHEET 40X57 (DRAPES) ×2 IMPLANT
DRAPE LAPAROTOMY 100X72X124 (DRAPES) ×2 IMPLANT
DRAPE SURG 17X23 STRL (DRAPES) ×2 IMPLANT
DRSG OPSITE 4X5.5 SM (GAUZE/BANDAGES/DRESSINGS) ×2 IMPLANT
DRSG OPSITE POSTOP 4X8 (GAUZE/BANDAGES/DRESSINGS) ×2 IMPLANT
DURAPREP 26ML APPLICATOR (WOUND CARE) ×2 IMPLANT
ELECT REM PT RETURN 9FT ADLT (ELECTROSURGICAL) ×2
ELECTRODE REM PT RTRN 9FT ADLT (ELECTROSURGICAL) ×1 IMPLANT
EVACUATOR 1/8 PVC DRAIN (DRAIN) ×2 IMPLANT
EVACUATOR 3/16  PVC DRAIN (DRAIN)
EVACUATOR 3/16 PVC DRAIN (DRAIN) IMPLANT
GAUZE 4X4 16PLY RFD (DISPOSABLE) ×2 IMPLANT
GAUZE SPONGE 4X4 12PLY STRL (GAUZE/BANDAGES/DRESSINGS) ×2 IMPLANT
GAUZE SPONGE 4X4 12PLY STRL LF (GAUZE/BANDAGES/DRESSINGS) ×2 IMPLANT
GLOVE BIO SURGEON STRL SZ7 (GLOVE) ×2 IMPLANT
GLOVE BIO SURGEON STRL SZ8 (GLOVE) ×4 IMPLANT
GLOVE BIOGEL PI IND STRL 7.0 (GLOVE) ×1 IMPLANT
GLOVE BIOGEL PI IND STRL 7.5 (GLOVE) ×1 IMPLANT
GLOVE BIOGEL PI INDICATOR 7.0 (GLOVE) ×1
GLOVE BIOGEL PI INDICATOR 7.5 (GLOVE) ×1
GLOVE EXAM NITRILE XL STR (GLOVE) IMPLANT
GLOVE INDICATOR 8.5 STRL (GLOVE) ×4 IMPLANT
GLOVE SURG SS PI 6.0 STRL IVOR (GLOVE) ×16 IMPLANT
GLOVE SURG SS PI 7.0 STRL IVOR (GLOVE) ×2 IMPLANT
GLOVE SURG UNDER POLY LF SZ6.5 (GLOVE) ×8 IMPLANT
GLOVE SURG UNDER POLY LF SZ7 (GLOVE) ×4 IMPLANT
GOWN STRL REUS W/ TWL LRG LVL3 (GOWN DISPOSABLE) ×5 IMPLANT
GOWN STRL REUS W/ TWL XL LVL3 (GOWN DISPOSABLE) ×3 IMPLANT
GOWN STRL REUS W/TWL 2XL LVL3 (GOWN DISPOSABLE) ×2 IMPLANT
GOWN STRL REUS W/TWL LRG LVL3 (GOWN DISPOSABLE) ×5
GOWN STRL REUS W/TWL XL LVL3 (GOWN DISPOSABLE) ×3
HEMOSTAT POWDER KIT SURGIFOAM (HEMOSTASIS) IMPLANT
KIT BASIN OR (CUSTOM PROCEDURE TRAY) ×2 IMPLANT
KIT INFUSE SMALL (Orthopedic Implant) ×2 IMPLANT
KIT POSITION SURG JACKSON T1 (MISCELLANEOUS) ×2 IMPLANT
KIT TURNOVER KIT B (KITS) ×2 IMPLANT
MARKER SKIN DUAL TIP RULER LAB (MISCELLANEOUS) ×2 IMPLANT
MILL MEDIUM DISP (BLADE) ×2 IMPLANT
NEEDLE HYPO 21X1.5 SAFETY (NEEDLE) ×2 IMPLANT
NEEDLE HYPO 25X1 1.5 SAFETY (NEEDLE) ×2 IMPLANT
NS IRRIG 1000ML POUR BTL (IV SOLUTION) ×2 IMPLANT
OIL CARTRIDGE MAESTRO DRILL (MISCELLANEOUS) ×2
PACK LAMINECTOMY NEURO (CUSTOM PROCEDURE TRAY) ×2 IMPLANT
PAD ARMBOARD 7.5X6 YLW CONV (MISCELLANEOUS) ×6 IMPLANT
PATTIES SURGICAL 1X1 (DISPOSABLE) ×2 IMPLANT
ROD TI CVD 5.5MMX90MM (Rod) ×4 IMPLANT
SCREW 6.5X5.5 30MM (Screw) ×8 IMPLANT
SCREW PA THRD CREO TULIP 5.5X4 (Head) ×16 IMPLANT
SHAFT CREO 30MM (Neuro Prosthesis/Implant) ×8 IMPLANT
SPACER SUSTAIN RT 12 15D 9X26 (Spacer) ×4 IMPLANT
SPACER SUSTAIN RT 12 8D 9X26 (Spacer) ×4 IMPLANT
SPACER TI SUSTAIN RT 10X26 8D (Spacer) ×4 IMPLANT
SPONGE LAP 4X18 RFD (DISPOSABLE) IMPLANT
SPONGE SURGIFOAM ABS GEL 100 (HEMOSTASIS) ×4 IMPLANT
STRIP CLOSURE SKIN 1/2X4 (GAUZE/BANDAGES/DRESSINGS) ×4 IMPLANT
SUT VIC AB 0 CT1 18XCR BRD8 (SUTURE) ×2 IMPLANT
SUT VIC AB 0 CT1 8-18 (SUTURE) ×2
SUT VIC AB 2-0 CP2 18 (SUTURE) IMPLANT
SUT VIC AB 2-0 CT1 18 (SUTURE) ×4 IMPLANT
SUT VIC AB 4-0 PS2 27 (SUTURE) ×2 IMPLANT
SYR 20ML LL LF (SYRINGE) ×4 IMPLANT
TOWEL GREEN STERILE (TOWEL DISPOSABLE) ×2 IMPLANT
TOWEL GREEN STERILE FF (TOWEL DISPOSABLE) ×2 IMPLANT
TRAY FOLEY MTR SLVR 16FR STAT (SET/KITS/TRAYS/PACK) ×2 IMPLANT
WATER STERILE IRR 1000ML POUR (IV SOLUTION) ×2 IMPLANT

## 2020-01-17 NOTE — H&P (Signed)
Christopher Ramsey is an 70 y.o. male.   Chief Complaint: Back pain neurogenic claudication bilateral leg pain and numbness HPI: 70 year old gentleman with progressive worsening back pain bilateral hip and leg pain difficulty walking very short distances numbness tingling in his legs and his feet.  Work-up revealed severe spinal stenosis from L2 down to L5 due to the patient's progression of clinical syndrome imaging findings and failed conservative treatment recommended decompressive laminectomy interbody fusions at L2-3, L3-4, L4-5.  I extensively gone over the risks and benefits of the operation with him as well as perioperative course expectations of outcome and alternatives of surgery and he understands and agrees to proceed forward.  Past Medical History:  Diagnosis Date  . Anxiety    situational  . Arthritis   . Cancer ([place])    hx testicular cancer  . Hypertension   . Polycythemia    from testostero  . Sleep apnea    recent sleep study-moderate-has not done cpap trial    Past Surgical History:  Procedure Laterality Date  . ANTERIOR CRUCIATE LIGAMENT REPAIR Left    cadivar ligament  . COLONOSCOPY    . HERNIA REPAIR     rt ing age 57  . ORCHIECTOMY     one-testicular cancer age 73-radiation post op    History reviewed. No pertinent family history. Social History:  reports that he has never smoked. He has never used smokeless tobacco. He reports current alcohol use of about 2.0 standard drinks of alcohol per week. He reports that he does not use drugs.  Allergies: No Known Allergies  Medications Prior to Admission  Medication Sig Dispense Refill  . allopurinol (ZYLOPRIM) 300 MG tablet Take 300 mg by mouth daily.    . cholecalciferol (VITAMIN D3) 25 MCG (1000 UNIT) tablet Take 1,000 Units by mouth daily.    Marland Kitchen levothyroxine (SYNTHROID) 50 MCG tablet Take 50 mcg by mouth daily before breakfast.    . losartan-hydrochlorothiazide (HYZAAR) 100-25 MG tablet Take 1 tablet by mouth  daily.    . tamsulosin (FLOMAX) 0.4 MG CAPS capsule Take 0.4 mg by mouth in the morning and at bedtime.      Results for orders placed or performed during the hospital encounter of 01/17/20 (from the past 48 hour(s))  ABO/Rh     Status: None (Preliminary result)   Collection Time: 01/17/20  6:50 AM  Result Value Ref Range   ABO/RH(D) PENDING    No results found.  Review of Systems  Musculoskeletal: Positive for back pain and gait problem.  Neurological: Positive for numbness.    Blood pressure (!) 156/81, pulse 88, temperature 98 F (36.7 C), temperature source Oral, resp. rate 18, height 6' (1.829 m), weight 106.6 kg, SpO2 98 %. Physical Exam HENT:     Head: Normocephalic.     Right Ear: Tympanic membrane normal.     Nose: Nose normal.     Mouth/Throat:     Mouth: Mucous membranes are moist.  Eyes:     Pupils: Pupils are equal, round, and reactive to light.  Cardiovascular:     Rate and Rhythm: Normal rate.  Pulmonary:     Effort: Pulmonary effort is normal.  Abdominal:     General: Abdomen is flat.  Musculoskeletal:        General: Normal range of motion.  Skin:    General: Skin is warm.  Neurological:     Mental Status: He is alert.     Comments: Is awake strength is 5  out of 5 iliopsoas, quads, hamstrings, gastrocs, into tibialis, and EHL.      Assessment/Plan 70 year old presents for decompressive laminectomy interbody fusion L2-L5  Elaina Hoops, MD 01/17/2020, 7:28 AM

## 2020-01-17 NOTE — Anesthesia Procedure Notes (Addendum)
Procedure Name: Intubation Date/Time: 01/17/2020 7:42 AM Performed by: Claris Che, CRNA Pre-anesthesia Checklist: Patient identified, Emergency Drugs available, Suction available, Patient being monitored and Timeout performed Patient Re-evaluated:Patient Re-evaluated prior to induction Oxygen Delivery Method: Circle system utilized Preoxygenation: Pre-oxygenation with 100% oxygen Induction Type: Cricoid Pressure applied Ventilation: Mask ventilation without difficulty Laryngoscope Size: Mac and 4 Grade View: Grade III Tube type: Oral Tube size: 7.5 mm Number of attempts: 1 Airway Equipment and Method: Stylet Placement Confirmation: ETT inserted through vocal cords under direct vision,  positive ETCO2 and breath sounds checked- equal and bilateral Secured at: 23 cm Tube secured with: Tape Dental Injury: Teeth and Oropharynx as per pre-operative assessment

## 2020-01-17 NOTE — Transfer of Care (Signed)
Immediate Anesthesia Transfer of Care Note  Patient: Quinlin Conant  Procedure(s) Performed: POSTERIOR LUMBAR INTERBODY FUSION- LUMBAR TWO-THREE, LUMBAR THREE-FOUR, LUMBAR FOUR-FIVE- POSTERIOR LATERAL AND INTERBODY FUSION. (N/A Spine Lumbar)  Patient Location: PACU  Anesthesia Type:General  Level of Consciousness: awake, drowsy, patient cooperative and responds to stimulation  Airway & Oxygen Therapy: Patient Spontanous Breathing and Patient connected to nasal cannula oxygen  Post-op Assessment: Report given to RN, Post -op Vital signs reviewed and stable and Patient moving all extremities X 4  Post vital signs: Reviewed and stable  Last Vitals:  Vitals Value Taken Time  BP 113/60 01/17/20 1336  Temp    Pulse 104 01/17/20 1339  Resp 21 01/17/20 1339  SpO2 95 % 01/17/20 1339  Vitals shown include unvalidated device data.  Last Pain:  Vitals:   01/17/20 0604  TempSrc: Oral  PainSc: 0-No pain      Patients Stated Pain Goal: 3 (27/25/36 6440)  Complications: No complications documented.

## 2020-01-17 NOTE — Anesthesia Postprocedure Evaluation (Signed)
Anesthesia Post Note  Patient: Christopher Ramsey  Procedure(s) Performed: POSTERIOR LUMBAR INTERBODY FUSION- LUMBAR TWO-THREE, LUMBAR THREE-FOUR, LUMBAR FOUR-FIVE- POSTERIOR LATERAL AND INTERBODY FUSION. (N/A Spine Lumbar)     Patient location during evaluation: PACU Anesthesia Type: General Level of consciousness: sedated Pain management: pain level controlled Vital Signs Assessment: post-procedure vital signs reviewed and stable Respiratory status: spontaneous breathing and respiratory function stable Cardiovascular status: stable Postop Assessment: no apparent nausea or vomiting Anesthetic complications: yes   Encounter Complications  Complication Outcome Phase Comment  Prolonged N/V requiring intervention Resolved in Lab In Recovery     Last Vitals:  Vitals:   01/17/20 1430 01/17/20 1445  BP: 110/66 (!) 101/55  Pulse: 91 92  Resp: 15 (!) 22  Temp:    SpO2: 96% 94%    Last Pain:  Vitals:   01/17/20 1445  TempSrc:   PainSc: Asleep    LLE Motor Response: Purposeful movement (01/17/20 1445) LLE Sensation: Full sensation (01/17/20 1445) RLE Motor Response: Purposeful movement (01/17/20 1445) RLE Sensation: Full sensation (01/17/20 1445)      Elease Swarm DANIEL

## 2020-01-17 NOTE — Op Note (Signed)
Preoperative diagnosis: Lumbar spinal stenosis degenerative disc disease neurogenic claudication and bilateral L3-L4 and 5 radiculopathies and a grade 1 spondylolisthesis L4-5  Postoperative diagnosis: Same  Procedure: #1 decompressive lumbar laminectomies with complete medial facetectomies and radical foraminotomies L2-3, L3-4, L4-5 in excess and requiring more work than would be needed with a standard interbody fusion.  2.  Posterior lumbar interbody fusion utilizing the globus insert and rotate titanium cages packed with locally harvested autograft mixed with vivigen and BMP L2-3, L3-4, L4-5  3.  Cortical screw fixation L2-L5 utilizing the globus Creo modular cortical screw set  4.  Posterior lateral arthrodesis L2-L5 utilizing locally harvested autograft mixed with vivigen and BMP  5.  Open reduction spinal deformity L4-5  Surgeon: Dominica Severin Geanna Divirgilio  Assistant: Nash Shearer  Anesthesia: General  EBL: 34  HPI: 70 year old gentleman progressive worsening back bilateral hip and leg pain with severe spinal stenosis and neurogenic claudication work-up confirmed severe spinal stenosis L2-3 L3-4 L4-5 and due to patient's progression of clinical syndrome imaging findings and failed conservative treatment I recommended decompressive laminectomies complete middle facetectomies and radical laminotomies of the L2, L3, L4, and L5 nerve roots as well as interbody fusions I extensively over the risks and benefits of that operation with him as well as perioperative course expectations of outcome and alternatives of surgery and he understood and agreed to proceed forward.  Operative procedure: Patient was brought into the OR was [facility] general anesthesia position prone Wilson frame his back was prepped and draped in routine sterile fashion anatomic landmarks identified midline incision which was made after infiltration of 10 cc lidocaine with epi and Bovie electrocautery was used take down the subcutaneous  tissue and subperiosteal dissection was carried lamina of L2, L3, L4 and L5 bilaterally.  Intraoperative x-ray confirmed identification appropriate level.  So then the spinous processes at L2-L3 and L4 removed central decompression was initiated there was severe hourglass compression of thecal sac bilaterally primarily at L3-4 and L4-5 but also to moderate degree at L2-3.  Facets were drilled down completely to facetectomies were performed aggressive under biting of the superior tickling facet was carried out at all levels.  Radical foraminotomies were performed at the L2, L3, L4 and L5 nerve roots.  Then after we gained access to the lateral margin disc base at all levels disc bases were incised and under fluoroscopy utilizing sequential distraction all disks were removed there was a herniated disc on the right at L3-4 and this was all removed after adequate endplate preparation at all levels cages were inserted with an extensive mount of autograft packed centrally BMP was also packed anteriorly to the autograft packed centrally.  In each cage was the locally harvested autograft mixed with vivigen.  After all the interbody work been done tension taken a cortical screw placement again under fluoroscopy cortical screw entry points were drilled then the cortical screw under fluoroscopy was drilled tapped and all screws were placed all screws had excellent purchase and postop AP and lateral fluoroscopy confirmed good position of all the implants.  I then assembled the heads but advanced the screws some more fashion to rods anchored everything in place compressed L2 against L3 L3 against L4 and L4 against L5.  Then I aggressively decorticated the lateral facet joints from 2-5 and packed the remainder of the BMP and autograft mix posterior lateral to the rod along the facet joints.  Then the foramina reinspected to confirm patency and no migration of graft material Gelfoam was ON top  of the dura a medium Hemovac drain  was placed.  Exparel was then injected in the fascia and the wound was closed in layers with interrupted Vicryl and a running 4 subcuticular Dermabond benzoin Steri-Strips and a sterile dressing was applied patient recovery in stable condition.  At the end of the case all needle counts and sponge counts were correct.

## 2020-01-18 MED ORDER — OXYCODONE-ACETAMINOPHEN 5-325 MG PO TABS: 1.0000 | ORAL_TABLET | ORAL | 0 refills | Status: AC | PRN

## 2020-01-18 MED ORDER — CYCLOBENZAPRINE HCL 10 MG PO TABS: 10.0000 mg | ORAL_TABLET | Freq: Three times a day (TID) | ORAL | 0 refills | Status: AC | PRN

## 2020-01-18 MED FILL — Heparin Sodium (Porcine) Inj 1000 Unit/ML: INTRAMUSCULAR | Qty: 30 | Status: AC

## 2020-01-18 MED FILL — Thrombin For Soln 20000 Unit: CUTANEOUS | Qty: 1 | Status: AC

## 2020-01-18 MED FILL — Sodium Chloride IV Soln 0.9%: INTRAVENOUS | Qty: 1000 | Status: AC

## 2020-01-18 NOTE — Discharge Summary (Signed)
Physician Discharge Summary  Patient ID: Christopher Ramsey MRN: [id number] DOB/AGE: [birth year] 70 y.o.  Admit date: 01/17/2020 Discharge date: 01/18/2020  Admission Diagnoses: Lumbar spinal stenosis degenerative disc disease neurogenic claudication and bilateral L3-L4 and 5 radiculopathies and a grade 1 spondylolisthesis L4-5    Discharge Diagnoses: same   Discharged Condition: good  Hospital Course: The patient was admitted on 01/17/2020 and taken to the operating room where the patient underwent PLIF L2-5. The patient tolerated the procedure well and was taken to the recovery room and then to the floor in stable condition. The hospital course was routine. There were no complications. The wound remained clean dry and intact. Pt had appropriate back soreness. No complaints of leg pain or new N/T/W. The patient remained afebrile with stable vital signs, and tolerated a regular diet. The patient continued to increase activities, and pain was well controlled with oral pain medications.   Consults: None  Significant Diagnostic Studies:  Results for orders placed or performed during the hospital encounter of 01/17/20  ABO/Rh  Result Value Ref Range   ABO/RH(D)      O POS Performed at [facility], [place] [address] [place], [place] [address]     DG Lumbar Spine 2-3 Views  Result Date: 01/17/2020 CLINICAL DATA:  L2-3, 3-4 and 4-5 posterior lumbar interbody fusion. EXAM: LUMBAR SPINE - 2-3 VIEW; DG C-ARM 1-60 MIN COMPARISON:  MR lumbar spine 01/22/2019. FINDINGS: Four intraoperative fluoroscopic spot views of the lumbar spine are submitted. Number system utilized on 01/22/2019 is preserved. Pedicle screws and interbody spacers are seen from L2-L5. IMPRESSION: Intraoperative visualization for L2-5 posterior lumbar interbody fusion. Electronically Signed   By: Lorin Picket M.D.   On: 01/17/2020 12:55   DG C-Arm 1-60 Min  Result Date: 01/17/2020 CLINICAL DATA:  L2-3, 3-4 and 4-5  posterior lumbar interbody fusion. EXAM: LUMBAR SPINE - 2-3 VIEW; DG C-ARM 1-60 MIN COMPARISON:  MR lumbar spine 01/22/2019. FINDINGS: Four intraoperative fluoroscopic spot views of the lumbar spine are submitted. Number system utilized on 01/22/2019 is preserved. Pedicle screws and interbody spacers are seen from L2-L5. IMPRESSION: Intraoperative visualization for L2-5 posterior lumbar interbody fusion. Electronically Signed   By: Lorin Picket M.D.   On: 01/17/2020 12:55    Antibiotics:  Anti-infectives (From admission, onward)   Start     Dose/Rate Route Frequency Ordered Stop   01/17/20 2000  ceFAZolin (ANCEF) IVPB 2g/100 mL premix        2 g 200 mL/hr over 30 Minutes Intravenous Every 8 hours 01/17/20 1548 01/19/20 1959   01/17/20 0600  ceFAZolin (ANCEF) IVPB 2g/100 mL premix        2 g 200 mL/hr over 30 Minutes Intravenous On call to O.R. 01/17/20 0548 01/17/20 1147      Discharge Exam: Blood pressure 114/68, pulse 75, temperature 98.1 F (36.7 C), temperature source Oral, resp. rate 18, height 6' (1.829 m), weight 106.6 kg, SpO2 97 %. Neurologic: Grossly normal Ambulating and voiding well, incision cdi  Discharge Medications:   Allergies as of 01/18/2020   No Known Allergies     Medication List    TAKE these medications   allopurinol 300 MG tablet Commonly known as: ZYLOPRIM Take 300 mg by mouth daily.   cholecalciferol 25 MCG (1000 UNIT) tablet Commonly known as: VITAMIN D3 Take 1,000 Units by mouth daily.   cyclobenzaprine 10 MG tablet Commonly known as: FLEXERIL Take 1 tablet (10 mg total) by mouth 3 (three) times daily as  needed for muscle spasms.   levothyroxine 50 MCG tablet Commonly known as: SYNTHROID Take 50 mcg by mouth daily before breakfast.   losartan-hydrochlorothiazide 100-25 MG tablet Commonly known as: HYZAAR Take 1 tablet by mouth daily.   oxyCODONE-acetaminophen 5-325 MG tablet Commonly known as: Percocet Take 1 tablet by mouth every 4  (four) hours as needed for severe pain.   tamsulosin 0.4 MG Caps capsule Commonly known as: FLOMAX Take 0.4 mg by mouth in the morning and at bedtime.       Disposition: home   Final Dx: PLIF L2-5  Discharge Instructions     Remove dressing in 72 hours   Complete by: As directed    Call MD for:  difficulty breathing, headache or visual disturbances   Complete by: As directed    Call MD for:  hives   Complete by: As directed    Call MD for:  persistant dizziness or light-headedness   Complete by: As directed    Call MD for:  persistant nausea and vomiting   Complete by: As directed    Call MD for:  redness, tenderness, or signs of infection (pain, swelling, redness, odor or green/yellow discharge around incision site)   Complete by: As directed    Call MD for:  severe uncontrolled pain   Complete by: As directed    Call MD for:  temperature >100.4   Complete by: As directed    Diet - low sodium heart healthy   Complete by: As directed    Driving Restrictions   Complete by: As directed    No driving for 2 weeks, no riding in the car for 1 week   Increase activity slowly   Complete by: As directed    Lifting restrictions   Complete by: As directed    No lifting more than 8 lbs         Signed: Ocie Cornfield Lovis More 01/18/2020, 8:17 AM

## 2020-01-18 NOTE — Evaluation (Addendum)
Occupational Therapy Evaluation Patient Details Name: Christopher Ramsey MRN: [id number] DOB: Oct 16, 1950 Today's Date: 01/18/2020    History of Present Illness 70 y.o.  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 PMHx significant for anxiety, arthritis, Hx of testicular cancer, and HTN.   Clinical Impression   PTA patient was living with his wife in a 2-level private residence with bedroom/bathroom on main level and was independent with ADLs/IADLs without AD. Patient currently presents slightly below baseline level of function demonstrating Min guard to supervision A for ADLs/ADL transfers, and short-distance functional mobility in hospital room. Patient able to don/doff lumbar corset independently. OT provided education on spinal precautions, home set-up to maximize safety and independence with self-care tasks, and acquisition/use of AE/DME. Patient expressed verbal understanding but declined 3-in-1 despite recommendation for use in walk-in shower. Patient with deficits including decreased dynamic standing balance often reaching out for furniture or walls for stability. Patient would benefit from continued acute OT services in prep for safe d/c home with focus on balance and further education on use of DME to decrease risk of falls.    Follow Up Recommendations  No OT follow up;Supervision - Intermittent    Equipment Recommendations  3 in 1 bedside commode;Other (comment) (Patient declined 3-in-1)    Recommendations for Other Services       Precautions / Restrictions Precautions Precautions: Back Precaution Booklet Issued: Yes (comment) Precaution Comments: Hemovac at incision. Patient able to recall 3/3 back precautions after education. Required Braces or Orthoses: Spinal Brace Spinal Brace: Lumbar corset;Applied in sitting position;Applied in standing position Restrictions Weight Bearing  Restrictions: No      Mobility Bed Mobility Overal bed mobility: Needs Assistance Bed Mobility: Rolling;Sidelying to Sit Rolling: Supervision Sidelying to sit: Min guard       General bed mobility comments: Supervision A for rolling R in supine with log rolling technique. Min guard for sidelying to sitting EOB +rail.    Transfers Overall transfer level: Needs assistance Equipment used: None Transfers: Sit to/from [facility] Sit to Stand: Min guard Stand pivot transfers: Min guard       General transfer comment: Min guard to stabilize. Patient reporting stiffness in back upon standing and with short-distance mobility in room.    Balance Overall balance assessment: Mild deficits observed, not formally tested                                         ADL either performed or assessed with clinical judgement   ADL Overall ADL's : Needs assistance/impaired     Grooming: Supervision/safety;Standing           Upper Body Dressing : Set up;Sitting   Lower Body Dressing: Min guard;Sit to/from stand               Functional mobility during ADLs: Min guard General ADL Comments: Patient often reaching out for furniture and walls for stability. Declined use of RW.     Vision Baseline Vision/History: Wears glasses Patient Visual Report: No change from baseline Vision Assessment?: No apparent visual deficits     Perception     Praxis      Pertinent Vitals/Pain Pain Assessment: 0-10 Pain Score: 6  Pain Location: Back (incisional) Pain Descriptors / Indicators: Sore;Pressure Pain Intervention(s): Limited activity within patient's tolerance;Monitored during session;Repositioned     Hand  Dominance Right   Extremity/Trunk Assessment Upper Extremity Assessment Upper Extremity Assessment: Overall WFL for tasks assessed   Lower Extremity Assessment Lower Extremity Assessment: Defer to PT evaluation   Cervical / Trunk  Assessment Cervical / Trunk Assessment: Other exceptions Cervical / Trunk Exceptions: s/p spinal sx.   Communication Communication Communication: No difficulties   Cognition Arousal/Alertness: Awake/alert Behavior During Therapy: WFL for tasks assessed/performed Overall Cognitive Status: Within Functional Limits for tasks assessed                                     General Comments  Soiled dressing at incision. RN notified.    Exercises     Shoulder Instructions      Home Living Family/patient expects to be discharged to:: Private residence Living Arrangements: Spouse/significant other Available Help at Discharge: Family;Available 24 hours/day Type of Home: House Home Access: Stairs to enter [employer] of Steps: 1 (From garage) Entrance Stairs-Rails: None Home Layout: Two level;Able to live on main level with bedroom/bathroom     Bathroom Shower/Tub: Occupational psychologist: Handicapped height     Home Equipment: None          Prior Functioning/Environment Level of Independence: Independent        Comments: Independent with ADLs/IADLs withou AD. Patient drives.        OT Problem List: Impaired balance (sitting and/or standing);Decreased knowledge of use of DME or AE;Pain      OT Treatment/Interventions: Self-care/ADL training;Therapeutic exercise;Energy conservation;DME and/or AE instruction;Therapeutic activities;Patient/family education    OT Goals(Current goals can be found in the care plan section) Acute Rehab OT Goals Patient Stated Goal: To return home. OT Goal Formulation: With patient Potential to Achieve Goals: Good ADL Goals Additional ADL Goal #1: Patient will complete morning ADLs with Mod I, AE and use of LRAD PRN.  OT Frequency: Other (comment) (1 follow-up session prior to d/c)   Barriers to D/C:            Co-evaluation              AM-PAC OT "6 Clicks" Daily Activity     Outcome Measure  Help from another person eating meals?: None Help from another person taking care of personal grooming?: A Little Help from another person toileting, which includes using toliet, bedpan, or urinal?: A Little Help from another person bathing (including washing, rinsing, drying)?: A Little Help from another person to put on and taking off regular upper body clothing?: None Help from another person to put on and taking off regular lower body clothing?: A Little 6 Click Score: 20   End of Session Equipment Utilized During Treatment: Gait belt;Back brace  Activity Tolerance: Patient tolerated treatment well Patient left: in chair;with call bell/phone within reach  OT Visit Diagnosis: Unsteadiness on feet (R26.81);Pain Pain - Right/Left:  (bilateral) Pain - part of body:  (Back at incision)                Time: 5188-4166 OT Time Calculation (min): 29 min Charges:  OT General Charges $OT Visit: 1 Visit OT Evaluation $OT Eval Moderate Complexity: 1 Mod OT Treatments $Self Care/Home Management : 8-22 mins  Bradan Congrove H. OTR/L Supplemental OT, Department of rehab services [phone]  Kaylah Chiasson R H. 01/18/2020, 9:30 AM

## 2020-01-18 NOTE — Evaluation (Signed)
Physical Therapy Evaluation Patient Details Name: Christopher Ramsey MRN: [id number] DOB: June 10, 1950 Today's Date: 01/18/2020   History of Present Illness  70 y.o.  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 PMHx significant for anxiety, arthritis, Hx of testicular cancer, and HTN.  Clinical Impression   Patient received up in recliner, pleasant but extremely anxious today, also a bit impulsive and impatient but suspect this may be baseline personality. Unsteady without BUE support once on his feet and reaching for external support- did much better with RW, but unfortunately gait distance in hallway was limited as his incision began leaking copious bloody drainage requiring return to his room for care by RN. Discussed and demonstrated stair practice however he twice refused to practice the step with PT today despite encouragement. Discussed and demonstrated car transfers, he also declined to practice this. Left up in recliner with all needs met, nurse tech present and attending. Would very much benefit from HHPT follow-up at DC.     Follow Up Recommendations Home health PT;Supervision/Assistance - 24 hour    Equipment Recommendations  Rolling walker with 5" wheels;3in1 (PT)    Recommendations for Other Services       Precautions / Restrictions Precautions Precautions: Back Precaution Booklet Issued: Yes (comment) Precaution Comments: Hemovac at incision. Patient able to recall 3/3 back precautions after education. Required Braces or Orthoses: Spinal Brace Spinal Brace: Lumbar corset;Applied in sitting position Restrictions Weight Bearing Restrictions: No      Mobility  Bed Mobility Overal bed mobility: Needs Assistance Bed Mobility: Rolling;Sidelying to Sit Rolling: Supervision Sidelying to sit: Min guard       General bed mobility comments: up in recliner upon tentry, declines further  practice of log rolling/bed mobility    Transfers Overall transfer level: Needs assistance Equipment used: None Transfers: Sit to/from [facility] Sit to Stand: Min guard Stand pivot transfers: Min assist       General transfer comment: min guard to stabilize when coming to standing, grossly unsteady without BUE support and needed MinA for balance and precautions for STP with no device  Ambulation/Gait Ambulation/Gait assistance: Min guard Gait Distance (Feet): 60 Feet Assistive device: Rolling walker (2 wheeled) Gait Pattern/deviations: Step-through pattern;Decreased step length - left;Decreased stance time - right;Decreased stride length Gait velocity: decreased   General Gait Details: slow but steady with RW; gait distance cut short as his incision began to leak copious bloody drainage and we had to return to the room for nursing care  Stairs            Wheelchair Mobility    Modified Rankin (Stroke Patients Only)       Balance Overall balance assessment: Needs assistance Sitting-balance support: No upper extremity supported;Feet supported Sitting balance-Leahy Scale: Good     Standing balance support: No upper extremity supported;During functional activity Standing balance-Leahy Scale: Poor Standing balance comment: reaching for external support, unsteady and needs MinA without BUE support                             Pertinent Vitals/Pain Pain Assessment: Faces Pain Score: 6  Faces Pain Scale: Hurts even more Pain Location: Back (incisional) Pain Descriptors / Indicators: Sore;Pressure;Guarding;Cramping Pain Intervention(s): Limited activity within patient's tolerance;Monitored during session;Repositioned    Home Living Family/patient expects to be discharged to:: Private residence Living Arrangements: Spouse/significant other Available Help at Discharge: Family;Available 24 hours/day Type  of Home: House Home Access: Stairs  to enter Entrance Stairs-Rails: None Entrance Stairs-Number of Steps: 1 (From garage) Home Layout: Two level;Able to live on main level with bedroom/bathroom Home Equipment: None      Prior Function Level of Independence: Independent         Comments: Independent with ADLs/IADLs withou AD. Patient drives.     Hand Dominance   Dominant Hand: Right    Extremity/Trunk Assessment   Upper Extremity Assessment Upper Extremity Assessment: Defer to OT evaluation    Lower Extremity Assessment Lower Extremity Assessment: Generalized weakness    Cervical / Trunk Assessment Cervical / Trunk Assessment: Other exceptions Cervical / Trunk Exceptions: s/p spinal sx.  Communication   Communication: No difficulties  Cognition Arousal/Alertness: Awake/alert Behavior During Therapy: Anxious;Impulsive Overall Cognitive Status: Within Functional Limits for tasks assessed                                 General Comments: polite but very anxious and perseverating on his hemovac; impulsive and can be a little impatient at times but suspect this is baseline      General Comments General comments (skin integrity, edema, etc.): incision with copious bloody draining, RN notified    Exercises     Assessment/Plan    PT Assessment Patient needs continued PT services  PT Problem List Decreased knowledge of use of DME;Decreased activity tolerance;Decreased safety awareness;Decreased balance;Decreased knowledge of precautions;Pain;Decreased mobility       PT Treatment Interventions DME instruction;Balance training;Gait training;Stair training;Functional mobility training;Patient/family education;Therapeutic activities;Therapeutic exercise;Manual techniques    PT Goals (Current goals can be found in the Care Plan section)  Acute Rehab PT Goals Patient Stated Goal: To return home. PT Goal Formulation: With patient Time For Goal Achievement: 02/01/20 Potential to Achieve Goals:  Fair    Frequency Min 5X/week   Barriers to discharge        Co-evaluation               AM-PAC PT "6 Clicks" Mobility  Outcome Measure Help needed turning from your back to your side while in a flat bed without using bedrails?: A Little Help needed moving from lying on your back to sitting on the side of a flat bed without using bedrails?: A Little Help needed moving to and from a bed to a chair (including a wheelchair)?: A Little Help needed standing up from a chair using your arms (e.g., wheelchair or bedside chair)?: A Little Help needed to walk in hospital room?: A Little Help needed climbing 3-5 steps with a railing? : A Lot 6 Click Score: 17    End of Session Equipment Utilized During Treatment: Back brace Activity Tolerance: Patient tolerated treatment well Patient left: in chair;with call bell/phone within reach Nurse Communication: Mobility status PT Visit Diagnosis: Unsteadiness on feet (R26.81);Difficulty in walking, not elsewhere classified (R26.2);Muscle weakness (generalized) (M62.81);Pain Pain - Right/Left:  (midline) Pain - part of body:  (back pain)    Time: 5053-9767 PT Time Calculation (min) (ACUTE ONLY): 30 min   Charges:   PT Evaluation $PT Eval Moderate Complexity: 1 Mod PT Treatments $Gait Training: 8-22 mins        Windell Norfolk, DPT, PN1   Supplemental Physical Therapist [place]    Pager [phone] Acute Rehab Office [phone]

## 2020-01-18 NOTE — Plan of Care (Signed)
Pt and wife given D/C instructions with verbal understanding. Rx's were sent to the pharmacy by MD. Pt's incision has some drainage, dressing was changed prior to D/C. Pt's IV and Hemovac were removed prior to D/C. Pt received RW and 3-n-1 per MD order. Pt D/C'd home via wheelchair per MD order. Pt is stable @ D/C and has no other needs at this time. Holli Humbles, RN

## 2020-04-26 ENCOUNTER — Other Ambulatory Visit: Payer: Self-pay | Admitting: Neurosurgery

## 2020-04-26 DIAGNOSIS — M5440 Lumbago with sciatica, unspecified side: Secondary | ICD-10-CM

## 2020-05-05 ENCOUNTER — Ambulatory Visit
Admission: RE | Admit: 2020-05-05 | Discharge: 2020-05-05 | Disposition: A | Discharge status: Home / Self Care | Payer: Medicare PPO | Source: Ambulatory Visit | Attending: Neurosurgery | Admitting: Neurosurgery

## 2020-05-05 ENCOUNTER — Other Ambulatory Visit: Payer: Self-pay

## 2020-05-05 DIAGNOSIS — M5440 Lumbago with sciatica, unspecified side: Secondary | ICD-10-CM

## 2021-01-02 ENCOUNTER — Other Ambulatory Visit: Payer: Self-pay | Admitting: Student

## 2021-01-02 DIAGNOSIS — M4316 Spondylolisthesis, lumbar region: Secondary | ICD-10-CM

## 2021-01-26 ENCOUNTER — Ambulatory Visit
Admission: RE | Admit: 2021-01-26 | Discharge: 2021-01-26 | Disposition: A | Discharge status: Home / Self Care | Payer: Medicare PPO | Source: Ambulatory Visit | Attending: Student | Admitting: Student

## 2021-01-26 DIAGNOSIS — M4316 Spondylolisthesis, lumbar region: Secondary | ICD-10-CM

## 2022-01-04 IMAGING — MR MR LUMBAR SPINE W/O CM
4 of 5 series · 25 of 48 positions shown · non-contrast
Comparison: Radiographs dated 12/08/2018

CLINICAL DATA: Progressive low back pain and stiffness. Weakness
and numbness in the legs.

EXAM:
MRI LUMBAR SPINE WITHOUT CONTRAST
TECHNIQUE: Multiplanar, multisequence MR imaging of the lumbar spine was
performed. No intravenous contrast was administered.

[Series 2: T2 · sagittal · 4.0mm · 0.55mm/px · 6 of 16 slices shown (1 of 2)]
[im 1/16]
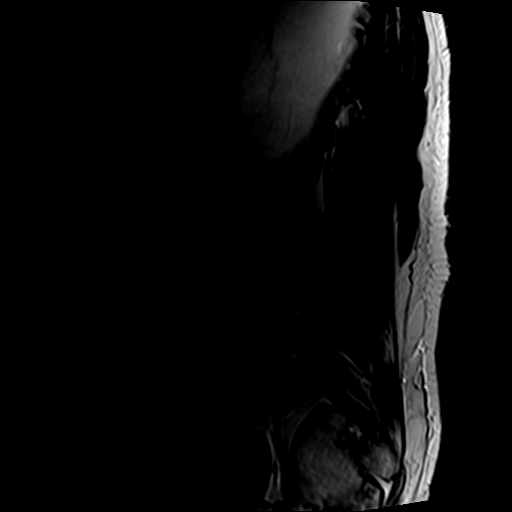
[im 4/16]
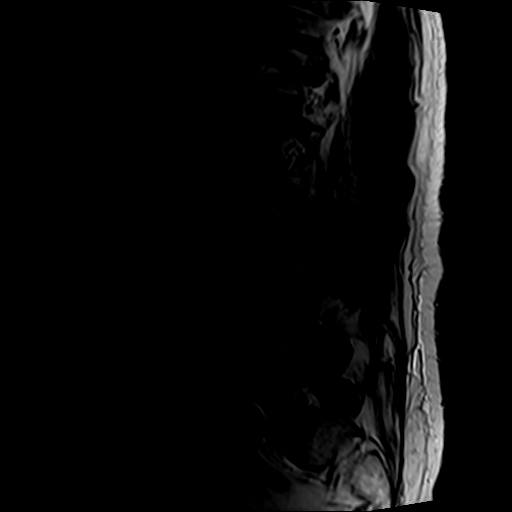
[im 7/16]
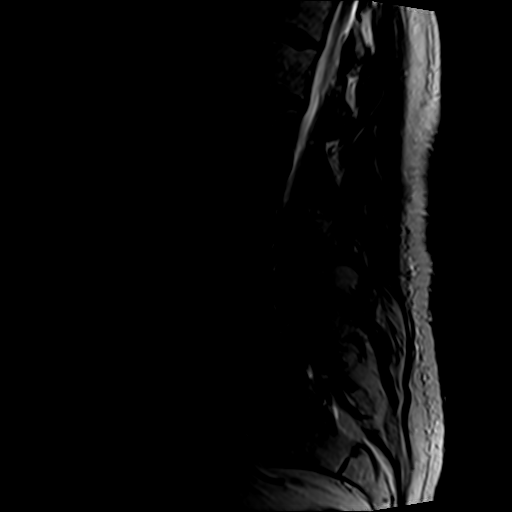
[im 10/16]
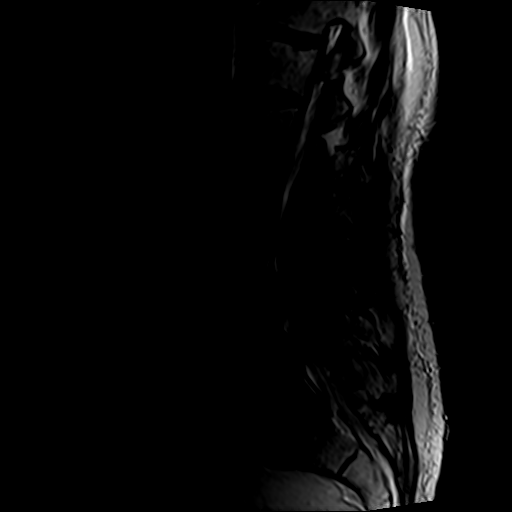
[im 13/16]
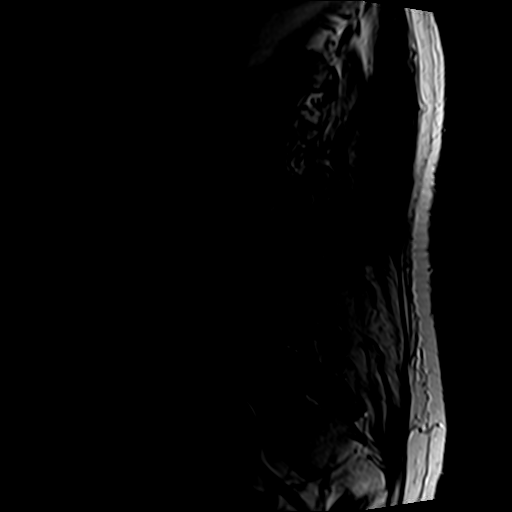
[im 16/16]
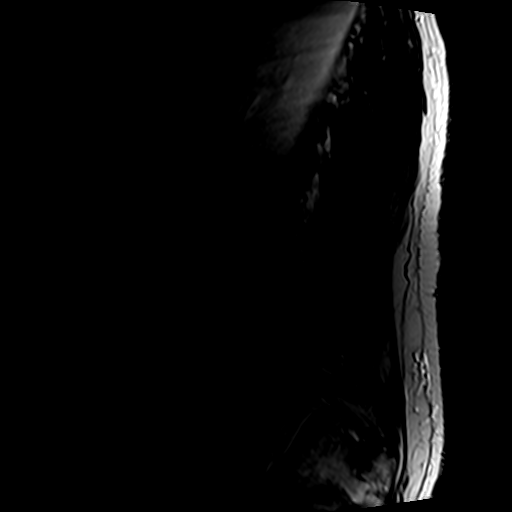

[Series 4: T1 · sagittal · 4.0mm · 0.55mm/px · 6 of 16 slices shown (1 of 2)]
[im 1/16]
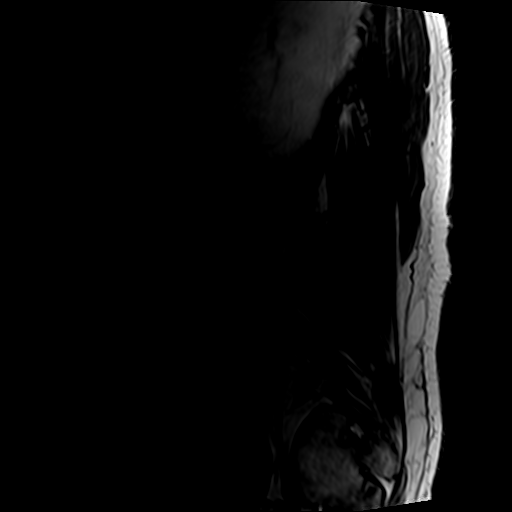
[im 4/16]
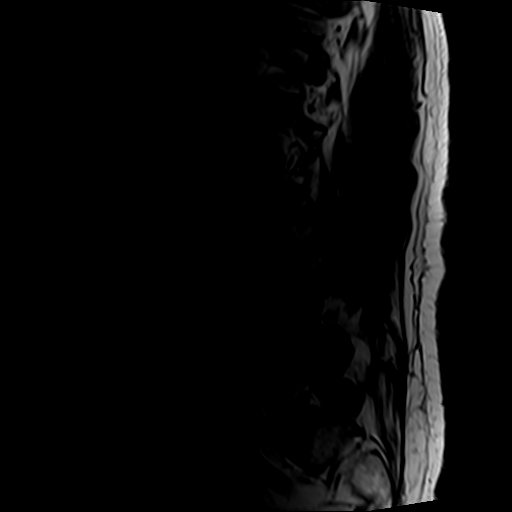
[im 7/16]
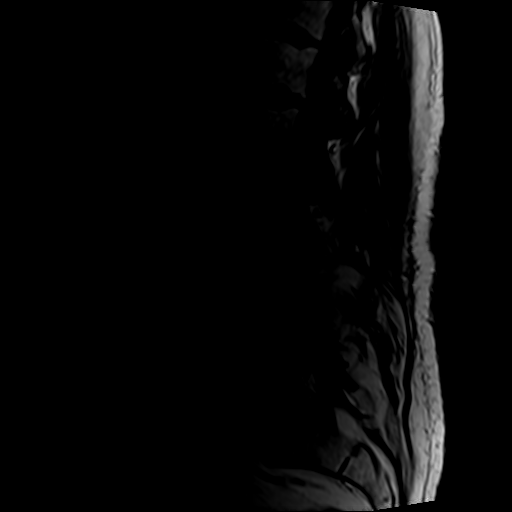
[im 10/16]
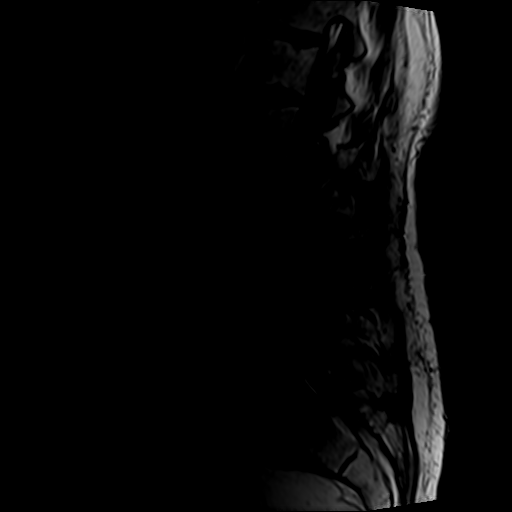
[im 13/16]
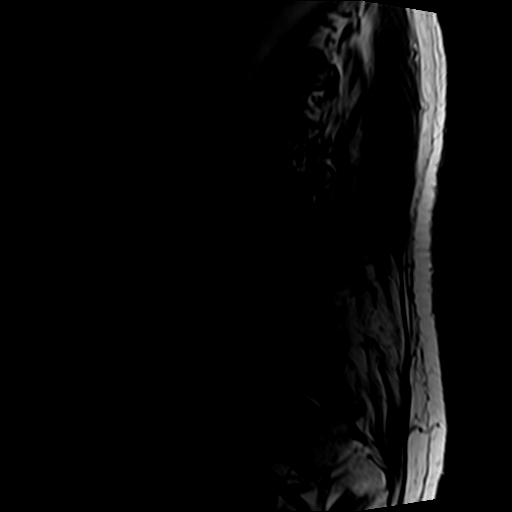
[im 16/16]
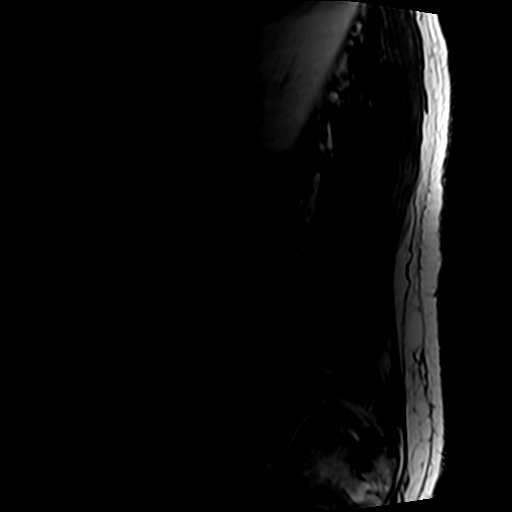

[Series 5: T2 · axial · 4.0mm · 0.70mm/px · z∈[-133,+95]mm · 9 of 41 slices shown (2 of 2)]
[im 1/41]
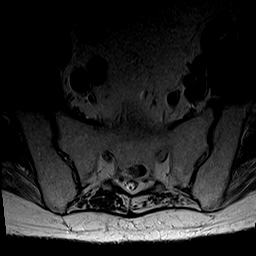
[im 6/41]
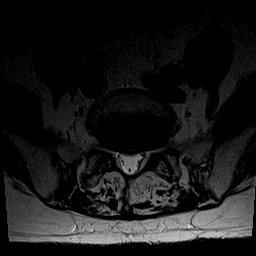
[im 12/41]
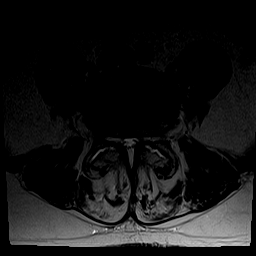
[im 18/41]
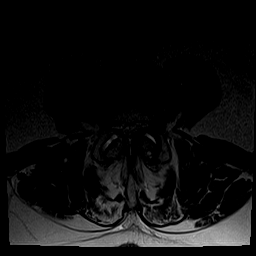
[im 21/41]
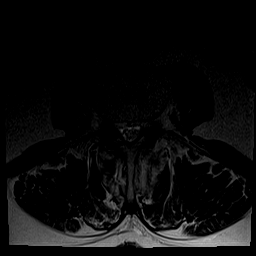
[im 23/41]
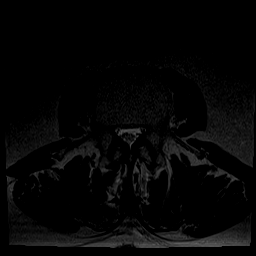
[im 29/41]
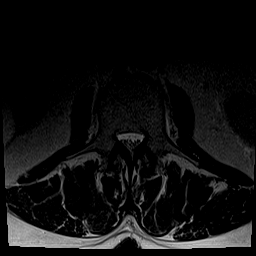
[im 35/41]
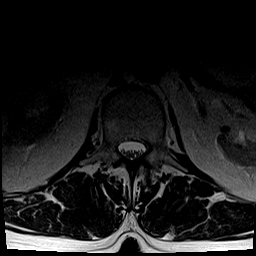
[im 41/41]
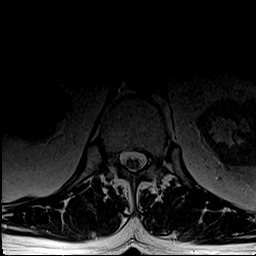

[Series 6: T1 · axial · 4.0mm · 0.35mm/px · z∈[-133,+64]mm · 4 of 41 slices shown (2 of 2)]
[im 1/41]
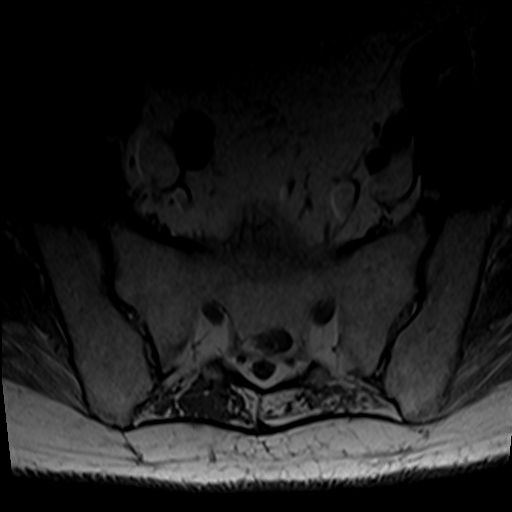
[im 6/41]
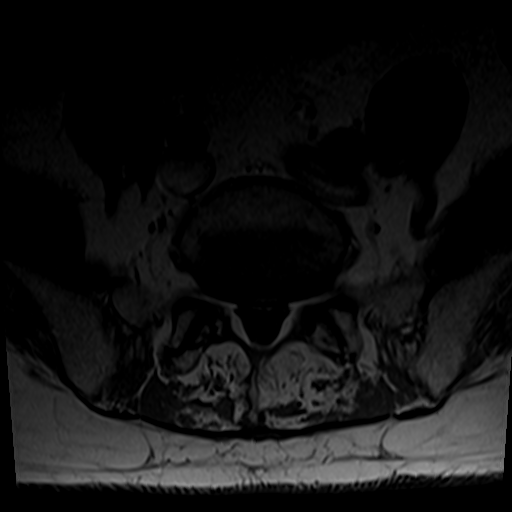
[im 21/41]
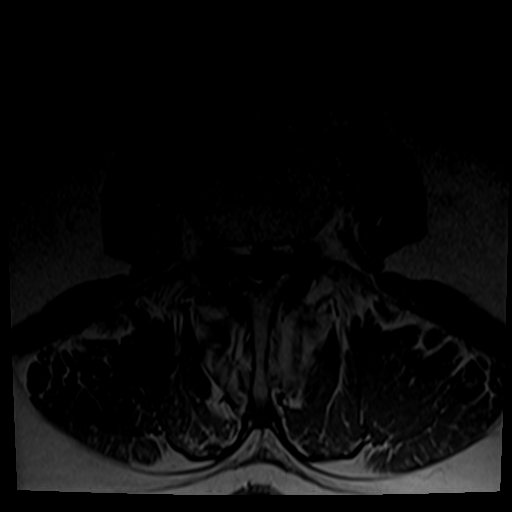
[im 35/41]
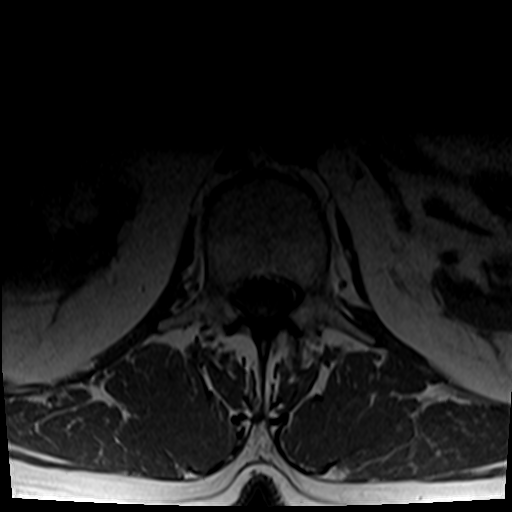

[25 of 48 positions shown; findings below may reference images not displayed]

FINDINGS: Segmentation:  Standard.

Alignment:  Grade 1 spondylolisthesis at L4-5.

Vertebrae:  No fracture, evidence of discitis, or bone lesion.

Conus medullaris and cauda equina: Conus extends to the L1-2 level.
Conus appears normal.

Paraspinal and other soft tissues: Negative.

Disc levels:

T12-L1: Negative.

L1-2: Negative.

L2-3: Tiny broad-based disc bulge with a right far lateral bulge.
Congenitally short pedicles. Slight hypertrophy of the ligamentum
flavum and facet joints. Minimal narrowing of the spinal canal
without focal neural impingement.

L3-4: Small broad-based soft disc protrusion. Marked hypertrophy of
the ligamentum flavum and facet joints which combine to create
severe spinal stenosis and severe compression of both lateral
recesses. Congenitally short pedicles. This should affect the L4 and
more distal nerve roots.

L4-5: 4 mm spondylolisthesis secondary to severe bilateral facet
arthritis. Minimal disc bulging extending into both neural foramina
with moderately severe bilateral foraminal stenosis which could
affect either or both L4 nerves. Moderate spinal stenosis with
compression of the transverse diameter of the thecal sac.

L5-S1: Tiny central disc bulge with no neural impingement. Moderate
bilateral hypertrophy of the ligamentum flavum. No focal neural
impingement.
IMPRESSION: 1. Severe spinal stenosis at L3-4 due to a combination of a
broad-based soft disc protrusion, hypertrophy of the ligamentum
flavum and facet joints, and ligamentum flavum.
2. Moderate spinal stenosis at L4-5 due to a combination of
hypertrophy of the ligamentum flavum and facet joints and a grade 1
spondylolisthesis.
3. Moderately severe bilateral foraminal stenosis at L4-5 which
could affect either or both L4 nerves.
